# Patient Record
Sex: Female | Born: 1950 | Race: White | Hispanic: No | Smoking: Never smoker
Health system: Southern US, Community
[De-identification: ages and names within clinical notes are randomized; demographics above are authoritative.]

## PROBLEM LIST (undated history)

## (undated) DIAGNOSIS — K219 Gastro-esophageal reflux disease without esophagitis: Secondary | ICD-10-CM

## (undated) DIAGNOSIS — I10 Essential (primary) hypertension: Secondary | ICD-10-CM

## (undated) HISTORY — PX: TONSILLECTOMY: SUR1361

## (undated) HISTORY — DX: Essential (primary) hypertension: I10

## (undated) HISTORY — DX: Gastro-esophageal reflux disease without esophagitis: K21.9

## (undated) HISTORY — PX: ABDOMINAL HYSTERECTOMY: SHX81

## (undated) HISTORY — PX: BREAST BIOPSY: SHX20

## (undated) HISTORY — PX: OTHER SURGICAL HISTORY: SHX169

---

## 2008-02-04 ENCOUNTER — Ambulatory Visit: Payer: Self-pay | Admitting: Internal Medicine

## 2008-02-09 ENCOUNTER — Encounter: Payer: Self-pay | Admitting: Internal Medicine

## 2008-02-09 ENCOUNTER — Ambulatory Visit: Payer: Self-pay | Admitting: Internal Medicine

## 2008-02-09 ENCOUNTER — Ambulatory Visit (HOSPITAL_COMMUNITY): Admission: RE | Admit: 2008-02-09 | Discharge: 2008-02-09 | Payer: Self-pay | Admitting: Internal Medicine

## 2008-03-21 ENCOUNTER — Ambulatory Visit: Payer: Self-pay | Admitting: Internal Medicine

## 2008-10-14 ENCOUNTER — Ambulatory Visit: Payer: Self-pay | Admitting: Internal Medicine

## 2008-12-12 ENCOUNTER — Ambulatory Visit (HOSPITAL_BASED_OUTPATIENT_CLINIC_OR_DEPARTMENT_OTHER): Admission: RE | Admit: 2008-12-12 | Discharge: 2008-12-12 | Payer: Self-pay | Admitting: Specialist

## 2009-02-08 ENCOUNTER — Inpatient Hospital Stay (HOSPITAL_COMMUNITY): Admission: RE | Admit: 2009-02-08 | Discharge: 2009-02-11 | Payer: Self-pay | Admitting: Specialist

## 2010-01-19 ENCOUNTER — Inpatient Hospital Stay (HOSPITAL_COMMUNITY): Admission: RE | Admit: 2010-01-19 | Discharge: 2010-01-22 | Payer: Self-pay | Admitting: Specialist

## 2010-09-20 ENCOUNTER — Encounter (INDEPENDENT_AMBULATORY_CARE_PROVIDER_SITE_OTHER): Payer: Self-pay | Admitting: *Deleted

## 2010-09-26 NOTE — Letter (Signed)
Summary: Recall Office Visit  [facility]  [address]   [place], [place] [address]   Phone: [phone]  Fax: [phone]      September 20, 2010   Yvette [name] [address] [place], [place]  [address] 11/15/1950   Dear Ms. Cerrone,   According to our records, it is time for you to schedule a follow-up office visit with [place].   At your convenience, please call [phone] to schedule an office visit. If you have any questions, concerns, or feel that this letter is in error, we would appreciate your call.   Sincerely,    Diana Eves  [facility] Ph: [phone]   Fax: [phone]

## 2010-11-05 LAB — DIFFERENTIAL
Basophils Absolute: 0 10*3/uL (ref 0.0–0.1)
Basophils Relative: 1 % (ref 0–1)
Eosinophils Absolute: 0.1 10*3/uL (ref 0.0–0.7)
Eosinophils Relative: 3 % (ref 0–5)
Lymphocytes Relative: 30 % (ref 12–46)
Lymphs Abs: 1.7 10*3/uL (ref 0.7–4.0)
Monocytes Absolute: 0.4 10*3/uL (ref 0.1–1.0)
Neutrophils Relative %: 59 % (ref 43–77)

## 2010-11-05 LAB — BASIC METABOLIC PANEL
BUN: 5 mg/dL — ABNORMAL LOW (ref 6–23)
BUN: 8 mg/dL (ref 6–23)
CO2: 27 mEq/L (ref 19–32)
Calcium: 8.8 mg/dL (ref 8.4–10.5)
Chloride: 106 mEq/L (ref 96–112)
Chloride: 106 mEq/L (ref 96–112)
Creatinine, Ser: 0.69 mg/dL (ref 0.4–1.2)
GFR calc Af Amer: >60 mL/min (ref >60)
GFR calc Af Amer: >60 mL/min (ref >60)
GFR calc Af Amer: >60 mL/min (ref >60)
GFR calc non Af Amer: >60 mL/min (ref >60)
GFR calc non Af Amer: >60 mL/min (ref >60)
Potassium: 3.2 mEq/L — ABNORMAL LOW (ref 3.5–5.1)
Sodium: 137 mEq/L (ref 135–145)
Sodium: 140 mEq/L (ref 135–145)

## 2010-11-05 LAB — URINALYSIS, ROUTINE W REFLEX MICROSCOPIC
Bilirubin Urine: NEGATIVE
Bilirubin Urine: NEGATIVE
Glucose, UA: NEGATIVE mg/dL
Glucose, UA: NEGATIVE mg/dL
Hgb urine dipstick: NEGATIVE
Nitrite: NEGATIVE
Specific Gravity, Urine: 1.01 (ref 1.005–1.030)
Urobilinogen, UA: 0.2 mg/dL (ref 0.0–1.0)

## 2010-11-05 LAB — PROTIME-INR
INR: 1.17 (ref 0.00–1.49)
INR: 1.76 — ABNORMAL HIGH (ref 0.00–1.49)
INR: 1.02 (ref 0.00–1.49)
Prothrombin Time: 14.8 seconds (ref 11.6–15.2)
Prothrombin Time: 20.4 seconds — ABNORMAL HIGH (ref 11.6–15.2)

## 2010-11-05 LAB — CBC
HCT: 30.3 % — ABNORMAL LOW (ref 36.0–46.0)
HCT: 30.5 % — ABNORMAL LOW (ref 36.0–46.0)
HCT: 31.8 % — ABNORMAL LOW (ref 36.0–46.0)
HCT: 36.2 % (ref 36.0–46.0)
Hemoglobin: 10.3 g/dL — ABNORMAL LOW (ref 12.0–15.0)
MCHC: 33.7 g/dL (ref 30.0–36.0)
MCHC: 33.9 g/dL (ref 30.0–36.0)
MCV: 90.2 fL (ref 78.0–100.0)
MCV: 90.6 fL (ref 78.0–100.0)
Platelets: 270 10*3/uL (ref 150–400)
Platelets: 308 10*3/uL (ref 150–400)
RBC: 3.36 MIL/uL — ABNORMAL LOW (ref 3.87–5.11)
RBC: 3.99 MIL/uL (ref 3.87–5.11)
RDW: 14.4 % (ref 11.5–15.5)
RDW: 14.1 % (ref 11.5–15.5)
WBC: 12.2 10*3/uL — ABNORMAL HIGH (ref 4.0–10.5)
WBC: 13.3 10*3/uL — ABNORMAL HIGH (ref 4.0–10.5)

## 2010-11-05 LAB — COMPREHENSIVE METABOLIC PANEL
ALT: 27 U/L (ref 0–35)
Alkaline Phosphatase: 83 U/L (ref 39–117)
CO2: 25 mEq/L (ref 19–32)
Chloride: 108 mEq/L (ref 96–112)
GFR calc Af Amer: >60 mL/min (ref >60)
GFR calc non Af Amer: >60 mL/min (ref >60)
Glucose, Bld: 92 mg/dL (ref 70–99)
Total Bilirubin: 0.5 mg/dL (ref 0.3–1.2)

## 2010-11-05 LAB — TYPE AND SCREEN
ABO/RH(D): O NEG
Antibody Screen: NEGATIVE

## 2010-11-05 LAB — URINE CULTURE: Special Requests: NEGATIVE

## 2010-11-26 LAB — CBC
HCT: 30.5 % — ABNORMAL LOW (ref 36.0–46.0)
HCT: 32.5 % — ABNORMAL LOW (ref 36.0–46.0)
HCT: 33.7 % — ABNORMAL LOW (ref 36.0–46.0)
Hemoglobin: 10.2 g/dL — ABNORMAL LOW (ref 12.0–15.0)
Hemoglobin: 11 g/dL — ABNORMAL LOW (ref 12.0–15.0)
MCHC: 33.5 g/dL (ref 30.0–36.0)
MCHC: 33.9 g/dL (ref 30.0–36.0)
MCV: 87.9 fL (ref 78.0–100.0)
MCV: 89.1 fL (ref 78.0–100.0)
MCV: 89.4 fL (ref 78.0–100.0)
Platelets: 254 10*3/uL (ref 150–400)
Platelets: 273 10*3/uL (ref 150–400)
RBC: 3.41 MIL/uL — ABNORMAL LOW (ref 3.87–5.11)
RBC: 3.63 MIL/uL — ABNORMAL LOW (ref 3.87–5.11)
RBC: 3.78 MIL/uL — ABNORMAL LOW (ref 3.87–5.11)
RBC: 4.31 MIL/uL (ref 3.87–5.11)
RDW: 13.8 % (ref 11.5–15.5)
RDW: 14.2 % (ref 11.5–15.5)
RDW: 14.6 % (ref 11.5–15.5)
WBC: 13 10*3/uL — ABNORMAL HIGH (ref 4.0–10.5)
WBC: 13.6 10*3/uL — ABNORMAL HIGH (ref 4.0–10.5)

## 2010-11-26 LAB — BASIC METABOLIC PANEL
BUN: 4 mg/dL — ABNORMAL LOW (ref 6–23)
CO2: 24 mEq/L (ref 19–32)
CO2: 26 mEq/L (ref 19–32)
Calcium: 9 mg/dL (ref 8.4–10.5)
Chloride: 100 mEq/L (ref 96–112)
Chloride: 106 mEq/L (ref 96–112)
GFR calc Af Amer: >60 mL/min (ref >60)
GFR calc Af Amer: >60 mL/min (ref >60)
GFR calc Af Amer: >60 mL/min (ref >60)
GFR calc non Af Amer: >60 mL/min (ref >60)
GFR calc non Af Amer: >60 mL/min (ref >60)
Glucose, Bld: 105 mg/dL — ABNORMAL HIGH (ref 70–99)
Potassium: 3.7 mEq/L (ref 3.5–5.1)
Potassium: 4.1 mEq/L (ref 3.5–5.1)
Potassium: 4.2 mEq/L (ref 3.5–5.1)
Sodium: 132 mEq/L — ABNORMAL LOW (ref 135–145)
Sodium: 138 mEq/L (ref 135–145)

## 2010-11-26 LAB — COMPREHENSIVE METABOLIC PANEL
ALT: 27 U/L (ref 0–35)
Albumin: 4.2 g/dL (ref 3.5–5.2)
Calcium: 9.7 mg/dL (ref 8.4–10.5)
Glucose, Bld: 124 mg/dL — ABNORMAL HIGH (ref 70–99)
Sodium: 141 mEq/L (ref 135–145)
Total Protein: 7 g/dL (ref 6.0–8.3)

## 2010-11-26 LAB — DIFFERENTIAL
Basophils Absolute: 0 10*3/uL (ref 0.0–0.1)
Eosinophils Absolute: 0.2 10*3/uL (ref 0.0–0.7)
Lymphs Abs: 1.8 10*3/uL (ref 0.7–4.0)
Monocytes Relative: 5 % (ref 3–12)
Neutro Abs: 3.7 10*3/uL (ref 1.7–7.7)
Neutrophils Relative %: 62 % (ref 43–77)

## 2010-11-26 LAB — TYPE AND SCREEN: ABO/RH(D): O NEG

## 2010-11-26 LAB — ABO/RH: ABO/RH(D): O NEG

## 2010-11-26 LAB — PROTIME-INR
INR: 1.8 — ABNORMAL HIGH (ref 0.00–1.49)
INR: 2 — ABNORMAL HIGH (ref 0.00–1.49)
Prothrombin Time: 13.8 seconds (ref 11.6–15.2)
Prothrombin Time: 24.1 seconds — ABNORMAL HIGH (ref 11.6–15.2)

## 2010-11-26 LAB — URINALYSIS, ROUTINE W REFLEX MICROSCOPIC
Glucose, UA: NEGATIVE mg/dL
Hgb urine dipstick: NEGATIVE
Specific Gravity, Urine: 1.006 (ref 1.005–1.030)
pH: 6 (ref 5.0–8.0)

## 2010-11-28 LAB — POCT I-STAT 4, (NA,K, GLUC, HGB,HCT)
Glucose, Bld: 90 mg/dL (ref 70–99)
HCT: 42 % (ref 36.0–46.0)
Hemoglobin: 14.3 g/dL (ref 12.0–15.0)
Potassium: 4 mEq/L (ref 3.5–5.1)

## 2011-01-01 NOTE — Assessment & Plan Note (Signed)
NAMECHRYSTA, [name]                 CHART#:  [id number]   DATE:  10/14/2008                       DOB:  [birth year]   Followup GERD/erosive reflux esophagitis, history of Schatzki ring  status post dilation previously.  [name] has done extremely well  on Aciphex 20 mg orally daily.  Reflux symptoms have been well-  controlled.  She remains significantly over her ideal body weight.  No  dysphagia, abdominal pain, melena or rectal bleeding.  She is up-to-date  on screening colonoscopy as she reports per Dr. Cleotis Nipper some 5 years  ago.  She sees Dr. Daphine Deutscher over at [facility] for  her primary care needs.   CURRENT MEDICATIONS:  See updated list.   ALLERGIES:  No known drug allergies.   PHYSICAL EXAMINATION:  On exam today she looks well.  Weight 235, height  5 feet 4-1/2 inches.  Temperature 98, BP 128/70, pulse 76.  SKIN:  Warm and dry.  CHEST:  Lungs are clear to auscultation.  CARDIAC:  Regular rate and rhythm without murmur, gallop, rub.  ABDOMEN:  Obese, nondistended, positive bowel sounds, soft, nontender  without appreciable mass or organomegaly.   ASSESSMENT:  History of gastroesophageal reflux disease/erosive reflux  esophagitis.  Symptoms well-controlled on Aciphex.  She has no recurrent  dysphagia symptoms.   RECOMMENDATIONS:  1. Antireflux lifestyle/diet.  2. Continue Aciphex 20 mg orally daily.  She may or may not need her      esophagus dilated in the future.  Any recurrence of symptoms would      dictate that approach.  Unless something comes up, we will see Ms.      Yvette [name] back in the office in 2 years.  We will keep her      Aciphex prescription current until she returns.  Of course, should      she have any interim problems, she is urged to let me know.       Jonathon Bellows, M.D.  Electronically Signed     RMR/MEDQ  D:  10/14/2008  T:  10/14/2008  Job:  [id number]   cc:   Daphine Deutscher, M.D.

## 2011-01-01 NOTE — H&P (Signed)
NAMECARDELIA, [name]              ACCOUNT NO.:  [id number]   MEDICAL RECORD NO.:  [id number]          PATIENT TYPE:  INP   LOCATION:                               FACILITY:  [facility]   PHYSICIAN:  Jene Every, M.D.    DATE OF BIRTH:  1951-07-17   DATE OF ADMISSION:  02/08/2009  DATE OF DISCHARGE:                              HISTORY & PHYSICAL   CHIEF COMPLAINT:  Left knee pain.   HISTORY OF PRESENT ILLNESS:  Yvette [name] is a pleasant 60 year old  female who has had off and on bilateral knee pain for quite some time.  She recently underwent a right knee arthroscopy and is doing well from  this.  Unfortunately, she notes worsening of her left knee pain.  She  has had previous arthroscopy done in the past with only short-term  relief.  Radiographs do reveal end-stage osteoarthrosis of the left knee  with some loss of range of motion.  It is felt at this point the patient  would benefit from a total knee arthroplasty.  Risks and benefits of the  surgery were discussed with the patient and she does wish to proceed.   PAST MEDICAL HISTORY:  Significant for hypertension, gastroesophageal  reflux disease, and seasonal allergies.   CURRENT MEDICATIONS:  Amlodipine 10 mg one p.o. daily, benazepril HCl 20  mg one p.o. daily, Aciphex 20 mg one p.o. daily, fexofenadine HCl 180 mg  one p.o. daily, half an aspirin 325 one p.o. daily, magnesium 250 mg  daily, D3 1000 units, fish oil 1000 mg daily, calcium with D 600 mg  daily, Super B complex, flax oil.   ALLERGIES:  None.   SOCIAL HISTORY:  The patient is divorced.  She is a Runner, broadcasting/film/video.  History is  negative for tobacco.  Occasional or rare alcohol consumption.   PRIMARY CARE PHYSICIAN:  Paulene Floor at [facility].   PAST SURGICAL HISTORY:  Hysterectomy, right knee arthroscopy, left knee  arthroscopy, esophageal stretch.   FAMILY HISTORY:  Father deceased at 54 with coronary artery disease.  Mother deceased at 51  with cirrhosis of the liver.  She does have a  niece who has breast cancer.  A sibling was killed in the motor vehicle  accident at the age of 42.   REVIEW OF SYSTEMS:  GENERAL:  The patient denies any fever, chills,  night sweats or bleeding tendencies.  CNS: No blurred double vision, seizure, headache or paralysis.  RESPIRATORY:  No shortness of breath, productive cough or hemoptysis.  CARDIOVASCULAR:  No chest pain, angina or orthopnea.  GU:  No dysuria, hematuria or discharge.  The patient does note stress  incontinence.  GI: No nausea, vomiting, diarrhea, constipation or melena.  MUSCULOSKELETAL:  As per HPI.   PHYSICAL EXAMINATION:  Pulse 60, respiratory rate 10, BP 146/84.  GENERAL:  This is an overweight female sitting upright, in no acute  distress.  She does walk with a slight antalgic gait.  HEENT: Atraumatic, normocephalic.  Pupils equal, round, and reactive to  light.  EOMs intact.  NECK:  Supple.  No lymphadenopathy.  CHEST:  Clear to auscultation bilaterally.  No rhonchi, wheezes or  rales.  BREASTS/GU:  Not examined, not pertinent to HPI.  HEART:  Regular rate and rhythm without murmurs, gallops or rubs.  ABDOMEN:  Soft, nontender, nondistended.  Bowel sounds x4.  SKIN:  No rashes or lesions are noted.  LEFT KNEE:  There is mild effusion noted.  She is tender along the  medial joint line.  She does have pain with patellofemoral compression.  Range of motion is -3 to 110 degrees.   IMPRESSION:  Degenerative joint disease, left knee.   PLAN:  The patient will be admitted to [facility] and undergo  a left total knee arthroplasty.      Roma Schanz, P.A.      Jene Every, M.D.  Electronically Signed    CS/MEDQ  D:  01/30/2009  T:  01/30/2009  Job:  [id number]

## 2011-01-01 NOTE — Op Note (Signed)
NAME:  Yvette [name], Yvette [name]              ACCOUNT NO.:  [id number]   MEDICAL RECORD NO.:  [id number]          PATIENT TYPE:  AMB   LOCATION:  DAY                           FACILITY:  [facility]   PHYSICIAN:  R. Roetta Sessions, M.D. DATE OF BIRTH:  [birth year]   DATE OF PROCEDURE:  02/09/2008  DATE OF DISCHARGE:                               OPERATIVE REPORT   PROCEDURES:  EGD with Elease Hashimoto dilation followed by esophagus biopsy and  gastric biopsy.   INDICATIONS:  A 60 year old lady with history of intermittent dysphagia  for solids having treatment for regurgitation episodes for which she  takes Tums and Zantac on a regular basis.  EGD is now being done.  Potential for esophageal dilation reviewed.  Risk, benefits, and  alternatives have been discussed.  She is agreeable.  Please see  documentation medical record.   PROCEDURE NOTE:  O2 saturation, blood pressure, pulse, and respirations  were monitored throughout the entire procedure.   CONSCIOUS SEDATION:  Versed 5 mg IV and Demerol 125 mg IV in divided  doses.  Cetacaine spray for topical pharyngeal anesthesia.   FINDINGS:  Examination of tubular esophagus revealed circumferential  distal esophageal erosions.  There was no noncritical-appearing  Schatzki's ring.  Also, there were numerous 1-2 mm raised, pale nodules  throughout the esophageal mucosa.  Please see photos.  EG junction was  easily traversed with the scope.  Stomach:  Gastric cavity was empty and  insufflated well with air.  Thorough examination of the gastric mucosa  including retroflexion of the proximal stomach and esophagogastric  junction demonstrated nodularity of the antral folds with couple of tiny  superficial erosions.  There was no infiltrating process or ulcer.  Pylorus was patent and easily traversed.  Examination of the bulb and  second portion revealed no abnormalities.  Therapeutic/diagnostic  maneuvers performed:  Scope was withdrawn.  A 56-French Maloney  dilator  was passed at full insertion.  Look back revealed no apparent  complications related to the passage of the dilator.  Subsequently, one  of the nodular areas with erosion was biopsied for histologic study from  the antrum and subsequently one of the nodules in the esophagus were  biopsied for histologic study.  The patient tolerated the procedure well  and was reacted in Endoscopy.   IMPRESSION:  1. 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  2. Circumferential distal esophageal erosions consistent with mild      erosive reflux esophagitis.  3. Nodularity of the antrum with overlying erosions of uncertain      clinical significance, status post biopsy, otherwise normal      stomach, patent pylorus, normal D1 and D2.   RECOMMENDATIONS:  1. Antireflux list provided to [name].  Begin Prilosec 20 mg      orally daily.  2. Followup on path.  3. Further recommendations to follow.      Yvette [name], M.D.  Electronically Signed     RMR/MEDQ  D:  02/09/2008  T:  02/10/2008  Job:  [id number]   cc:   [facility] [place]  Fax: [phone]

## 2011-01-01 NOTE — Assessment & Plan Note (Signed)
NAMEBECKY, COLAN                 CHART#:  [id number]   DATE:  03/21/2008                       DOB:  1950-09-05   CHIEF COMPLAINT:  Followup EGD/GERD.   PROBLEM LIST:  1. Erosive reflux esophagitis with last EGD on 02/09/2008 by Dr.      Jena Gauss.  2. Dysphagia, which is resolved.   SUBJECTIVE:  The patient is a 60 year old Caucasian female with history  of intermittent esophageal dysphagia, regurgitation, and chronic GERD.  She was found to have an one 2 mm pale, raised nodules at the esophagus,  which were multiple and benign.  She had a hyperplastic polyp removed  from the antrum as well.  There was no evidence of H. pylori organisms.  She had mild erosive reflux esophagitis.  Overall, the patient is doing  very well.  She denies any heartburn, indigestion, dysphagia, or  odynophagia.  At this time, her appetite is good.  Denies any anorexia  or early satiety.  She is having some bloating and gassiness that she  has noticed and she started using Prilosec and she is wondering if there  is something that may not cause this side effect.   CURRENT MEDICATIONS:  See the list of 03/21/2008.   ALLERGIES:  No known drug allergies.   OBJECTIVE:  VITAL SIGNS:  Weight 232.5 pounds, height 64 inches,  temperature 98.7, blood pressure 130/80, and pulse 72.  GENERAL:  She is a well-developed and well-nourished, an obese Caucasian  female who is in no acute distress.  HEENT.  Sclerae clear and nonicteric.  Conjunctivae pink.  Oropharynx  pink and moist without any lesions.  CHEST:  Heart regular rate and rhythm.  Normal S1 and S2.  ABDOMEN:  Protuberant with positive bowel sounds x4.  No bruits  auscultated.  Soft, nontender and nondistended without palpable mass or  hepatosplenomegaly.  No rebound, tenderness, or guarding.  Exam is  limited given the patient's body habitus.  EXTREMITIES:  Without clubbing or edema.  SKIN:  Pink, warm, and dry.   ASSESSMENT:  Erosive reflux  esophagitis/complicated gastroesophageal  reflux disease and dysphagia, which has resolved on PPI.   Complains of gassiness and bloating with Prilosec.   PLAN:  Discontinue Prilosec and begin Aciphex 20 mg daily.  I have given  her 2 weeks worth of samples as well as a prescription for 30 with 5  refills.  Office visit in 6 months with Dr. Jena Gauss, at which time we can  determine whether she is going to need a long-term PPI use.       Lorenza Burton, N.P.  Electronically Signed     R. Roetta Sessions, M.D.  Electronically Signed    KJ/MEDQ  D:  03/22/2008  T:  03/23/2008  Job:  [id number]   cc:   [facility]

## 2011-01-01 NOTE — Op Note (Signed)
NAMEWANETA, FITTING              ACCOUNT NO.:  [id number]   MEDICAL RECORD NO.:  [id number]          PATIENT TYPE:  AMB   LOCATION:  [facility]                         FACILITY:  [facility]   PHYSICIAN:  Jene Every, M.D.    DATE OF BIRTH:  1951/07/15   DATE OF PROCEDURE:  12/12/2008  DATE OF DISCHARGE:                               OPERATIVE REPORT   PREOPERATIVE DIAGNOSES:  Degenerative joint disease, right knee, medial  and lateral meniscus tears.   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   PROCEDURE PERFORMED:  1. Right knee arthroscopy.  2. Partial medial and lateral meniscectomy.  3. Removal of loose body.  4. Chondroplasty of the patella, femoral sulcus, medial femoral      condyle.   BRIEF HISTORY:  A 60 year old with refractory knee pain.  MRI  radiographs indicating degenerative changes and meniscal tears  refractory to conservative treatment indicated for arthroscopic  evaluation, debridement, partial meniscectomy.  The risks and benefits  were discussed including bleeding, infection, damage to neurovascular  structures, no change in symptoms, worsening of symptoms, need for  repeat debridement, total knee arthroplasty, etc.   TECHNIQUE:  With the patient in supine position.  After induction of  adequate anesthesia 2 grams of Kefzol the right lower extremity was  prepped and draped in the sterile fashion.  A lateral parapatellar  portal and superomedial parapatellar portal was fashioned with a #11  blade.  Ingress cannula atraumatically placed.  Irrigant was utilized to  insufflate joint.  Under direct visualization the medial parapatellar  portal was fashioned with a #11 blade after localization with a 18 gauge  needle sparing the medial meniscus.  Noted immediately was a large  centimeter of loose body beneath femoral condyle.  This was retrieved  with a pituitary and saved per the patient's request.  Chondral flap  tears and extensive grade III changes in the femoral condyle was noted.  The shaver was introduced and utilized to perform a chondroplasty of the  medial femoral condyle to a stable base.  This was sized of  approximately to 2.5 x 2.5 cm.  The torn anterior medial meniscus was  noted and this was resected with a basket rongeur and further contoured  with a shaver and an Arthro-wand.  This was torn posteriorly and this  was resected in a similar fashion to a stable base, approximately 50% of  the meniscus was resected.  There was grade III changes of the tibial  plateau and chondroplasty was performed here.   At the intercondylar notch with fraying of the ACL and PCL, but it was  otherwise intact.   The lateral compartment revealed tearing extensively of the lateral  meniscus complex.  A basket rongeur was introduced and utilized to  perform partial lateral meniscectomy to a stable base and further  contoured with a 4.2 Kuda shaver and a Arthro-wand.  The remnant was  then stable to probe palpation as was the medial meniscus  and observed  through flexion and extension without obstruction.  Next, examination of  the suprapatellar pouch revealed extensive grade III changes of the  patellofemoral sulcus.  A chondroplasty was performed here as well.  There was normal patellofemoral tracking.  The gutters were unremarkable  as well.  Hypertrophic synovitis was noted in the suprapatellar pouch.  This was excised as well.   Next, the knee was copiously lavaged.  All compartments reexamined.  The  remnant of the menisci was stable to probe palpation.  No evidence of  residual loose bodies or debris amenable to arthroscopic intervention.   The knee was copiously lavaged.  All instrumentation was removed.  The  portals were closed with 4-0 nylon simple  sutures.  The 0.25% Marcaine  with epinephrine was infiltrated in the joint.  The wound was dressed  sterilely.  Awoken without difficulty and transported to the recovery  room in satisfactory condition.   The patient tolerated the procedure.  No complication.  No assistant.      Jene Every, M.D.  Electronically Signed     JB/MEDQ  D:  12/12/2008  T:  12/12/2008  Job:  [id number]

## 2011-01-01 NOTE — Op Note (Signed)
Yvette [name], Yvette [name]              ACCOUNT NO.:  [id number]   MEDICAL RECORD NO.:  [id number]          PATIENT TYPE:  INP   LOCATION:  0003                         FACILITY[facility]   PHYSICIAN:  Jene Every, M.D.    DATE OF BIRTH:  [birth year]   DATE OF PROCEDURE:  02/08/2009  DATE OF DISCHARGE:                               OPERATIVE REPORT   PREOPERATIVE DIAGNOSIS:  Degenerative joint disease, left knee.   POSTOPERATIVE DIAGNOSIS:  Degenerative joint disease, left knee.   PROCEDURE PERFORMED:  Left total knee arthroplasty.   ANESTHESIA:  Spinal.   ASSISTANT:  Roma Schanz, P.A.   COMPONENTS:  DePuy rotating platform, 4 femur, 4tibia, 12.5-mm insert,  38 patella.   BRIEF HISTORY:  A 60 year old with end-stage osteoarthrosis of the knee  refractory to conservative treatment.  X-rays indicating degenerative  change in the medial compartment and slight varus deformity and  indicated for full replacement.  Risks, benefits discussed including  bleeding, infection, damage to neurovascular structures, suboptimal  range of motion, DVT, PE, component loosening and need for revision,  arthrofibrosis, et Karie Soda.   TECHNIQUE:  The patient in supine position.  After adequate induction of  adequate general anesthesia, 2 grams Kefzol, the left lower extremity  was prepped and draped and exsanguinated in the usual sterile fashion.  Thigh tourniquet inflated to 300 mmHg.  Midline incision was then made.  Median parapatellar arthrotomy was performed.  Patella was everted, knee  was flexed.  We elevated the superficial soft tissues medially around  the patella and removed osteophytes with the [facility] rongeur.  Medial and  lateral menisci remnants were removed.  ACL was removed as well.  End-  stage arthrosis __________ of the medial compartment was noted.  Synovitis was noted.  Synovectomy was performed.  Clear synovial fluid  had been evacuated.  Erosive changes of the patella was noted as  well.  Step drill was utilized to enter the femoral canal, it was irrigated and  then an intramedullary guide 5 degree left was then placed, 10 off the  distal femur.  Oscillating saw was utilized to make this cut.  Soft  tissues well-protected and then used a sizing guide off the anterior  cortex, measured to a 4.  It was pinned in slight external rotation and  then anterior and posterior chamfer cuts were performed.  Again, the  soft tissue was well-protected.  Then turned towards the tibia and  subluxing the tibia with external alignment guide, bisecting the ankle  parallel to the tibia, zero degree cut, 10 off the high side which was  lateral.  This was checked with a__________ prior to that.  Felt to be a  satisfactory cut, so that this was then pinned. an oscillating saw  utilized to make the tibial cut.  We then used a trial spacer and it was  12.5 in flexion and extension, equivalent and stable in flexion and  extension.  Next, the wound was copiously irrigated.  The knee was  flexed.  Tibia subluxed, __________ were utilized.  We used a baseplate  on the tibia in the appropriate  rotation which was just medial to the  tibial tubercle.  Maximal coverage of the tibial plateau measured to be  a 4.  This was pinned, centrally drilled and the punch guide performed  and then left.  We then used a trial femur.  We used first the regular,  then the narrow.  The narrow seemed to fit better.  Just prior to the  placement of the femoral component, we used a box cut distal femoral  cutting jig and this was applied, bisecting the femoral condyles.  Box  cut was performed protecting the soft tissues with the initial cut and  initial blade.  We then put again as previously stated the trial femur,  first the regular, then the narrow, and reduced it with a 12.5 insert  full extension, full flexion, no lift-off.  Good stability to varus and  valgus stressing 0 to 30 degrees.  Patella was then  everted, measured at  23 and a 35, 38, it was 8 off of that.  We therefore, used a patellar  guide and removed 8 off of the thickness of the patella with an  oscillating saw.  This was trialed to 38.  The peg holes were drilled,  maximizing coverage, medializing the patella.  Patella showed normal  tracking with flexion/extension.  All trials were then removed and  inspected posteriorly in the joint for osteophytes and loose bodies,  there were none.  Menisci had been removed.  The popliteus was intact.  Wound copiously irrigated with pulsatile lavage at low pressure.  Then  knee was flexed and dried.  Next, the cement mixed on the back table in  the appropriate fashion, injected into the proximal tibia and then we  impacted the tibial 4 component into place.  Redundant cement removed.  A trial insert was then placed and we cemented and impacted the femur.  Redundant cement removed.  The 12.5 insert was then placed.  The knee  was then reduced.  Full extension was applied.  Redundant cement  removed.  Patella was cemented as well.  The patellar button was  cemented as well.  The patellar clamp and then the cement removed after  the appropriate curing of cement and this was tried, full extension and  full flexion.  Good stability to varus and valgus stressing at 0 and 30  degrees.  Negative anterior drawer.  We selected the 12.5.  This was  then removed and we checked posteriorly and removed redundant cement  with osteotomes and pickups.  We put the permanent 12.5 insert in and  again full extension full flexion, good stability to varus and valgus  stressing 0 to 30 degrees.  Normal patellofemoral tracking.  Next, the  wound  was again copiously irrigated with antibiotic irrigation.  Hemovac placed and brought out through a lateral stab wound on the skin  and Marcaine with epinephrine was placed in the wound.  Repair of the  patellar arthrotomy in slight flexion with #1 Vicryl interrupted  figure-  of-eight sutures, subcu with 0 and 2-0 Vicryl simple sutures.  Skin was  reapproximated with staples.  We had flexion against gravity at 90  degrees.  Tourniquet was deflated at 2 hours and there was adequate  revascularization of the lower extremity appreciated.  We placed a  sterile dressing on the wound and secured it with Ace bandages.   The patient was then transported to the hospital bed, extubated without  difficulty and transported to recovery room in satisfactory  condition.   The patient tolerated the procedure well, no complications.      Jene Every, M.D.  Electronically Signed     JB/MEDQ  D:  02/08/2009  T:  02/08/2009  Job:  [id number]

## 2011-01-01 NOTE — H&P (Signed)
NAME:  Yvette [name], Yvette [name]              ACCOUNT NO.:  [id number]   MEDICAL RECORD NO.:  [id number]          PATIENT TYPE:  AMB   LOCATION:  DAY                           FACILITY:  [facility]   PHYSICIAN:  R. Roetta Sessions, M.D. DATE OF BIRTH:  [birth year]   DATE OF ADMISSION:  DATE OF DISCHARGE:  LH                              HISTORY & PHYSICAL   CHIEF COMPLAINT:  Food getting stuck.   HISTORY OF PRESENT ILLNESS:  [name] is a 60 year old Caucasian  female.  She has had a 2-year history of intermittent dysphagia.  More  recently over the last 4 months she is having problems just about every  time she eats where food gets stuck, she feels in her mid esophagus.  She tells me it feels like a gas bubble builds up and nothing will  pass.  She occasionally regurgitates undigested food within a few  minutes of eating.  If she tries to drink, the liquid will come back as  well.  Otherwise if she slowly drinks liquid, she denies any problems  with dysphagia.  Denies any odynophagia.  Denies any nausea or vomiting.  She does occasionally have heartburn and indigestion, usually with spicy  foods.  She denies any anorexia or early satiety.  Denies any fatigue.  She is having 1-2 soft brown bowel movements a day.  Denies any rectal  bleeding or melena.  Denies any history of diarrhea or constipation.   PAST MEDICAL AND SURGICAL HISTORY:  1. She had a colonoscopy in 2006 by Dr. Sherin Quarry, which is normal.  2. She has a history of hypertension.  3. Seasonal allergies.  4. Arthroscopic knee surgery for meniscal disease.  5. She has a history of complete hysterectomy.  6. Right ankle surgery.  7. Tonsillectomy.   CURRENT MEDICATIONS:  1. Benazepril HCl 20 mg b.i.d.  2. Norvasc 10 mg daily.  3. Calcium once daily.  4. Vitamin D once daily.  5. Vitamin B once daily.  6. Zyrtec 10 mg daily.  7. Over-the-counter acid reflux pills p.r.n.   ALLERGIES:  No known drug allergies.   FAMILY HISTORY:   There is no known family history of colorectal  carcinoma or chronic GI problems.  Mother deceased at age 8 with  history of alcoholism.  Father deceased at 24 secondary to coronary  artery disease and diabetes mellitus.  She has 2 living sisters, 1  brother and has lost 1 sister.   SOCIAL HISTORY:  Mrs. Kreiter is divorced.  She has 3 children who  reside with her, age is 31, 52, and 15.  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  She denies any tobacco or drug use.  She occasionally  consumes alcoholic beverages.   REVIEW OF SYSTEMS:  See HPI, otherwise negative.   PHYSICAL EXAMINATION:  VITAL SIGNS:  Weight 232, height 64-1/2 inches,  temperature 98.3, blood pressure 130/82, and pulse 60.  GENERAL:  [name] is an obese Caucasian female who is alert,  oriented, pleasant, and cooperative in no acute distress.  HEENT:  Sclerae clear, nonicteric.  Conjunctivae pink.  Oropharynx pink  and moist without any lesions.  NECK:  Supple without mass or thyromegaly.  CHEST:  Heart regular rate and rhythm.  Normal S1 and S2 without  murmurs, clicks, rubs, or gallops.  LUNGS:  Clear to auscultation bilaterally.  ABDOMEN:  Positive bowel sounds x4.  No bruits auscultated.  Soft,  nontender, and nondistended without palpable mass or hepatosplenomegaly.  No rebound tenderness or guarding.  EXTREMITIES:  Without clubbing or edema bilaterally.  SKIN:  Pink, warm, and dry without any rash or jaundice.   IMPRESSION:  Ms. Stantz is a 60 year old Caucasian female with a 2-  year history of intermittent dysphagia with symptoms that have worsened  over the last 4 months including regurgitation symptoms that are mostly  present with solid foods, and she does have occasional heartburn.  Her  symptoms are suspicious for esophageal web, ring, or stricture and she  is going to need further evaluation to rule this out as well as  symptomatic hernia and complicated gastroesophageal reflux disease.    PLAN:  EGD with possible esophageal dilatation with Dr. Jena Gauss in the  near future.  Discussed this procedure including risks and benefits with  the patient but not limited to bleeding, infection, perforation, and  drug reaction.  She agrees with the plan, consent will be obtained.      Lorenza Burton, N.P.      Jonathon Bellows, M.D.  Electronically Signed    KJ/MEDQ  D:  02/04/2008  T:  02/05/2008  Job:  [id number]

## 2011-01-04 NOTE — Discharge Summary (Signed)
Yvette [name], Yvette [name]              ACCOUNT NO.:  [id number]   MEDICAL RECORD NO.:  [id number]          PATIENT TYPE:  INP   LOCATION:  1619                         FACILITY:  [facility]   PHYSICIAN:  Jene Every, M.D.    DATE OF BIRTH:  [birth year]   DATE OF ADMISSION:  02/08/2009  DATE OF DISCHARGE:  02/11/2009                               DISCHARGE SUMMARY   ADMISSION DIAGNOSES:  1. Degenerative joint disease, left knee.  2. Hypertension.  3. Gastroesophageal reflux disease.  4. Seasonal allergies.   DISCHARGE DIAGNOSES:  1. Degenerative joint disease, left knee.  2. Hypertension.  3. Gastroesophageal reflux disease.  4. Seasonal allergies.  5. Status post left total knee arthroplasty.  6. Asymptomatic hyponatremia, resolved.  Otherwise, same as above.   HISTORY:  Ms. Dunnigan is pleasant 60 year old female who has had off  and on bilateral knee pain for quite some time.  She underwent a right  knee arthroscopy recently and is doing well from this.  Unfortunately,  she has noted worsening of her left knee pain.  She had a previous left  knee arthroscopy done in the past with only short-term relief of  symptoms.  Radiographs of the left knee do reveal end-stage  osteoarthritis of the left knee with slight flexion contractures.  It is  felt at this point she would benefit from a total knee arthroplasty.  The risks and benefits of this were discussed with the patient.  She  does wish to proceed.   PROCEDURE:  The patient was taken to the operating room on February 08, 2009, and underwent a left total knee arthroplasty.   SURGEON:  Jene Every, M.D.   ASSISTANT:  Roma Schanz, PA-C.   ANESTHESIA:  Spinal.   COMPLICATIONS:  None.   TOURNIQUET TIME:  2 hours.   CONSULTATIONS:  PT/OT, Care Management.   LABORATORY DATA:  Preoperative CBC shows white cell count 5.9,  hemoglobin 13.1, hematocrit 37.9.  This was monitored throughout the  hospital course.  The patient did  have a spike in her white blood cell  count to 15.3.  This has started to trend downward and at the time of  discharge is 13.6.  Hemoglobin remained stable at time of discharge  10.2, hematocrit 30.5.  Routine coagulation studies done preoperatively  were within normal range.  The patient was placed on Coumadin  postoperatively for DVT prophylaxis.  At time of discharge, she was  therapeutic on her Coumadin with an INR of 2.0.  Routine chemistries  done preoperatively showed sodium 141, calcium 3.7, with glucose of 129.  Normal BUN and creatinine.  These, again, were monitored throughout the  hospital course.  She had a slight drop in her sodium to 132, but at  time of discharge normalized to 138.  Potassium at time of discharge was  3.7.  BUN, again, was in normal range.  GFR was greater than 60.  Routine liver function tests were within normal range.  Preoperative  urinalysis was negative.  Blood type is O negative.   Preoperative chest x-ray showed no acute cardiopulmonary findings.  There  is a small density in the right upper lobe, questionable whether  this is an overlap in vascular structures.  Recommend correlation with  previous chest x-ray or followup chest x-ray in approximately 4 months.   HOSPITAL COURSE:  The patient was taken to the operating room, underwent  the above stated procedure without difficulty.  One Hemovac drain was  placed intraoperatively.  She was then transferred to PACU and then to  the orthopedic floor for continued postoperative care.  She was placed  on PCA for pain relief.  Coumadin was started for DVT prophylaxis.  Postop day #1, the patient was doing fairly well.  Vital signs were  stable.  She wasafebrile.  Labs were stable.  She had slight  hyponatremia.  We consulted PT/OT.  Encouraged out of bed.  The patient  did well with therapy on postop day #1.  On postop day #2, she continued  do well.  Pain was well-controlled on p.o. analgesics.  She had  been up  and down out of bed to the restroom, she was voiding, and she was  passing flatus without difficulty.  Hemovac was discontinued.  Drain was  changed.  Incision was clean and dry.  Labs again remained stable.  Hemoglobin was 11.0.  She had an INR of 1.8.  The patient continued to  progress well with therapy.  On postop day #3, she had met all goals.  Again, incision was clean and dry.  Motor and neurovascular were intact  to the lower extremities.  It was felt at this point, the patient was  stable to be discharged to home.   DISPOSITION:  The patient was discharged home with Home Health PT/OT.  Wound Care is to change her dressing daily.  She is to follow with Dr.  Shelle Iron in approximately 10-14 days.  She is to call and make an  appointment.   DISCHARGE MEDICATIONS:  Include all home medications other than the anti-  inflammatories or aspirin.  She will take vitamin C 500 mg daily, Norco  p.r.n. pain, Robaxin p.r.n. spasm.  Coumadin as dosed per pharmacy.   She is to use her knee immobilizer until she can do straight leg raises  x10 and continue elevation at least 6x day for 20 minutes at a time.   CONDITION ON DISCHARGE:  Stable.   FINAL DIAGNOSIS:  Doing well status post left total knee arthroplasty.      Roma Schanz, P.A.      Jene Every, M.D.  Electronically Signed    CS/MEDQ  D:  03/02/2009  T:  03/02/2009  Job:  [id number]

## 2012-11-07 ENCOUNTER — Other Ambulatory Visit: Payer: Self-pay | Admitting: *Deleted

## 2012-11-25 ENCOUNTER — Other Ambulatory Visit: Payer: Self-pay | Admitting: *Deleted

## 2012-11-25 MED ORDER — AMLODIPINE BESYLATE 10 MG PO TABS: 10.0000 mg | ORAL_TABLET | Freq: Every day | ORAL | Status: DC

## 2012-11-25 MED ORDER — BENAZEPRIL HCL 20 MG PO TABS: 20.0000 mg | ORAL_TABLET | Freq: Two times a day (BID) | ORAL | Status: DC

## 2013-01-04 ENCOUNTER — Other Ambulatory Visit: Payer: Self-pay

## 2013-01-04 MED ORDER — BENAZEPRIL HCL 20 MG PO TABS: 20.0000 mg | ORAL_TABLET | Freq: Two times a day (BID) | ORAL | Status: DC

## 2013-01-04 MED ORDER — AMLODIPINE BESYLATE 10 MG PO TABS: 10.0000 mg | ORAL_TABLET | Freq: Every day | ORAL | Status: DC

## 2013-01-04 NOTE — Telephone Encounter (Signed)
Last refill told needed to be seen  No upcoming appt scheduled last seen 10/23/11

## 2013-02-02 ENCOUNTER — Other Ambulatory Visit: Payer: Self-pay | Admitting: *Deleted

## 2013-02-02 MED ORDER — AMLODIPINE BESYLATE 10 MG PO TABS: 10.0000 mg | ORAL_TABLET | Freq: Every day | ORAL | Status: DC

## 2013-02-02 MED ORDER — BENAZEPRIL HCL 20 MG PO TABS: 20.0000 mg | ORAL_TABLET | Freq: Two times a day (BID) | ORAL | Status: DC

## 2013-02-02 NOTE — Telephone Encounter (Signed)
Has scheduled appt with Bennie Pierini 02/17/2013

## 2013-02-17 ENCOUNTER — Ambulatory Visit (INDEPENDENT_AMBULATORY_CARE_PROVIDER_SITE_OTHER): Payer: BC Managed Care – PPO | Admitting: Nurse Practitioner

## 2013-02-17 ENCOUNTER — Encounter: Payer: Self-pay | Admitting: Nurse Practitioner

## 2013-02-17 VITALS — BP 132/75 | HR 64 | Temp 98.1°F | Ht 64.0 in | Wt 230.0 lb

## 2013-02-17 DIAGNOSIS — Z23 Encounter for immunization: Secondary | ICD-10-CM

## 2013-02-17 DIAGNOSIS — I10 Essential (primary) hypertension: Secondary | ICD-10-CM

## 2013-02-17 DIAGNOSIS — K219 Gastro-esophageal reflux disease without esophagitis: Secondary | ICD-10-CM

## 2013-02-17 MED ORDER — AMLODIPINE BESYLATE 10 MG PO TABS: 10.0000 mg | ORAL_TABLET | Freq: Every day | ORAL | Status: DC

## 2013-02-17 MED ORDER — BENAZEPRIL HCL 20 MG PO TABS: 20.0000 mg | ORAL_TABLET | Freq: Two times a day (BID) | ORAL | Status: DC

## 2013-02-17 MED ORDER — OMEPRAZOLE 20 MG PO CPDR: 20.0000 mg | DELAYED_RELEASE_CAPSULE | Freq: Every day | ORAL | Status: DC

## 2013-02-17 NOTE — Patient Instructions (Signed)

## 2013-02-17 NOTE — Progress Notes (Signed)
  Subjective:    Patient ID: Yvette [name], female    DOB: 20-Sep-1950, 62 y.o.   MRN: [id number]  Hypertension This is a chronic problem. The current episode started more than 1 year ago. The problem is unchanged. The problem is controlled. Pertinent negatives include no chest pain, headaches, malaise/fatigue, orthopnea, palpitations, peripheral edema or shortness of breath. There are no associated agents to hypertension. There are no known risk factors for coronary artery disease. Past treatments include calcium channel blockers and ACE inhibitors. The current treatment provides moderate improvement. Compliance problems include diet.   GERD Omeprazole working well- keeps symptoms under control   Review of Systems  Constitutional: Negative for malaise/fatigue.  Respiratory: Negative for shortness of breath.   Cardiovascular: Negative for chest pain, palpitations and orthopnea.  Neurological: Negative for headaches.  All other systems reviewed and are negative.       Objective:   Physical Exam  Constitutional: She is oriented to person, place, and time. She appears well-developed and well-nourished.  HENT:  Nose: Nose normal.  Mouth/Throat: Oropharynx is clear and moist.  Eyes: EOM are normal.  Neck: Trachea normal, normal range of motion and full passive range of motion without pain. Neck supple. No JVD present. Carotid bruit is not present. No thyromegaly present.  Cardiovascular: Normal rate, regular rhythm, normal heart sounds and intact distal pulses.  Exam reveals no gallop and no friction rub.   No murmur heard. Pulmonary/Chest: Effort normal and breath sounds normal.  Abdominal: Soft. Bowel sounds are normal. She exhibits no distension and no mass. There is no tenderness.  Musculoskeletal: Normal range of motion.  Lymphadenopathy:    She has no cervical adenopathy.  Neurological: She is alert and oriented to person, place, and time. She has normal reflexes.  Skin: Skin is  warm and dry.  Psychiatric: She has a normal mood and affect. Her behavior is normal. Judgment and thought content normal.     BP 132/75  Pulse 64  Temp(Src) 98.1 F (36.7 C) (Oral)  Ht 5\' 4"  (1.626 m)  Wt 230 lb (104.327 kg)  BMI 39.46 kg/m2      Assessment & Plan:   1. Need for Tdap vaccination   2. Hypertension   3. GERD (gastroesophageal reflux disease)    Orders Placed This Encounter  Procedures  . Tdap vaccine greater than or equal to 7yo IM   Meds ordered this encounter  Medications  . DISCONTD: omeprazole (PRILOSEC) 20 MG capsule    Sig: Take 20 mg by mouth daily.  Marland Kitchen omeprazole (PRILOSEC) 20 MG capsule    Sig: Take 1 capsule (20 mg total) by mouth daily.    Dispense:  30 capsule    Refill:  5  . benazepril (LOTENSIN) 20 MG tablet    Sig: Take 1 tablet (20 mg total) by mouth 2 (two) times daily.    Dispense:  60 tablet    Refill:  5    Needs to be seen before next refill  . amLODipine (NORVASC) 10 MG tablet    Sig: Take 1 tablet (10 mg total) by mouth daily.    Dispense:  30 tablet    Refill:  5    Needs to be seen before next refill   Continue all meds Labs pending Diet and exercise encouraged Follow -up in 3 months  Mary-Margaret Daphine Deutscher, FNP

## 2013-02-18 LAB — COMPLETE METABOLIC PANEL WITH GFR
AST: 18 U/L (ref 0–37)
BUN: 16 mg/dL (ref 6–23)
Calcium: 10.3 mg/dL (ref 8.4–10.5)
Chloride: 104 mEq/L (ref 96–112)
Creat: 0.88 mg/dL (ref 0.50–1.10)
Total Bilirubin: 0.3 mg/dL (ref 0.3–1.2)

## 2013-02-18 LAB — NMR LIPOPROFILE WITH LIPIDS
Cholesterol, Total: 211 mg/dL — ABNORMAL HIGH (ref <200)
HDL Size: 8.7 nm — ABNORMAL LOW (ref >=9.2)
LDL (calc): 139 mg/dL — ABNORMAL HIGH (ref <100)
LDL Particle Number: 1800 nmol/L — ABNORMAL HIGH (ref <1000)
LDL Size: 20.7 nm (ref >20.5)
Large VLDL-P: 6.6 nmol/L — ABNORMAL HIGH (ref <=2.7)
Small LDL Particle Number: 602 nmol/L — ABNORMAL HIGH (ref <=527)
VLDL Size: 54.3 nm — ABNORMAL HIGH (ref <=46.6)

## 2013-03-08 ENCOUNTER — Telehealth: Payer: Self-pay | Admitting: Nurse Practitioner

## 2013-03-08 NOTE — Telephone Encounter (Signed)
Patient has appt at 10:45 in the am

## 2013-03-09 ENCOUNTER — Ambulatory Visit (INDEPENDENT_AMBULATORY_CARE_PROVIDER_SITE_OTHER): Payer: BC Managed Care – PPO | Admitting: Family Medicine

## 2013-03-09 ENCOUNTER — Encounter: Payer: Self-pay | Admitting: Family Medicine

## 2013-03-09 VITALS — BP 124/70 | HR 62 | Temp 98.3°F | Ht 64.0 in | Wt 232.0 lb

## 2013-03-09 DIAGNOSIS — L282 Other prurigo: Secondary | ICD-10-CM

## 2013-03-09 MED ORDER — HYDROXYZINE HCL 25 MG PO TABS: 25.0000 mg | ORAL_TABLET | Freq: Three times a day (TID) | ORAL | Status: DC | PRN

## 2013-03-09 MED ORDER — PREDNISONE 50 MG PO TABS: ORAL_TABLET | ORAL | Status: DC

## 2013-03-09 NOTE — Progress Notes (Signed)
  Subjective:    Patient ID: Yvette [name], female    DOB: [birth year], 62 y.o.   MRN: [id number]  HPI HPI  This patient complains of a RASH  Location: diffuse. Upper ext, lower ext, abdomen   Onset: 3-4 days   Course: had sudden onset of intensily pruritic rash over last 3-4 days.   Self-treated with: benadryl   Improvement with treatment: minimal   History  Itching: yes  Tenderness: no  New medications/antibiotics: no  Pet exposure: no Recent travel or tropical exposure: no  New soaps, shampoos, detergent, clothing: no  Tick/insect exposure: no  Chemical Exposure: no  Red Flags  Feeling ill: no  Fever: no  Facial/tongue swelling/difficulty breathing: no  Diabetic or immunocompromised: no      Review of Systems  All other systems reviewed and are negative.       Objective:   Physical Exam  Constitutional: She appears well-developed and well-nourished.  HENT:  Head: Normocephalic and atraumatic.  Eyes: Conjunctivae are normal. Pupils are equal, round, and reactive to light.  Neck: Normal range of motion.  Cardiovascular: Normal rate and regular rhythm.   Pulmonary/Chest: Effort normal.  Abdominal: Soft.  Musculoskeletal: Normal range of motion.  Neurological: She is alert.  Skin: Rash noted.         + blanching lesions. Non tender      Assessment & Plan:  Pruritic rash - Plan: predniSONE (DELTASONE) 50 MG tablet, hydrOXYzine (ATARAX/VISTARIL) 25 MG tablet  Relatively unclear etiology. Distribution not characteristic of fungal etiology.  Will place on prednisone and atarax for treatment.  Discussed general care and derm red flags.  Follow up with derm if sxs not improved.

## 2013-03-26 ENCOUNTER — Other Ambulatory Visit: Payer: Self-pay | Admitting: Nurse Practitioner

## 2013-06-24 ENCOUNTER — Other Ambulatory Visit: Payer: Self-pay

## 2013-08-31 ENCOUNTER — Other Ambulatory Visit: Payer: Self-pay

## 2013-08-31 MED ORDER — AMLODIPINE BESYLATE 10 MG PO TABS: 10.0000 mg | ORAL_TABLET | Freq: Every day | ORAL | Status: DC

## 2013-08-31 MED ORDER — BENAZEPRIL HCL 20 MG PO TABS: 20.0000 mg | ORAL_TABLET | Freq: Two times a day (BID) | ORAL | Status: DC

## 2013-08-31 NOTE — Telephone Encounter (Signed)
Last seen 03/09/13  Dr Newton 

## 2013-09-17 ENCOUNTER — Other Ambulatory Visit: Payer: Self-pay | Admitting: *Deleted

## 2013-09-17 MED ORDER — OMEPRAZOLE 20 MG PO CPDR: 20.0000 mg | DELAYED_RELEASE_CAPSULE | Freq: Every day | ORAL | Status: DC

## 2013-10-06 ENCOUNTER — Telehealth: Payer: Self-pay | Admitting: Nurse Practitioner

## 2013-10-06 NOTE — Telephone Encounter (Signed)
Duplicate message. 

## 2013-10-07 MED ORDER — AMLODIPINE BESYLATE 10 MG PO TABS: 10.0000 mg | ORAL_TABLET | Freq: Every day | ORAL | Status: DC

## 2013-10-07 MED ORDER — BENAZEPRIL HCL 20 MG PO TABS: 20.0000 mg | ORAL_TABLET | Freq: Two times a day (BID) | ORAL | Status: DC

## 2013-10-07 NOTE — Telephone Encounter (Signed)
rx sent to pharmacy- no more refills without being seen

## 2013-10-26 ENCOUNTER — Ambulatory Visit (INDEPENDENT_AMBULATORY_CARE_PROVIDER_SITE_OTHER): Payer: BC Managed Care – PPO | Admitting: Nurse Practitioner

## 2013-10-26 ENCOUNTER — Encounter: Payer: Self-pay | Admitting: Nurse Practitioner

## 2013-10-26 VITALS — BP 142/74 | HR 58 | Temp 97.8°F | Ht 64.0 in | Wt 233.4 lb

## 2013-10-26 DIAGNOSIS — I10 Essential (primary) hypertension: Secondary | ICD-10-CM

## 2013-10-26 DIAGNOSIS — Z1239 Encounter for other screening for malignant neoplasm of breast: Secondary | ICD-10-CM

## 2013-10-26 DIAGNOSIS — Z1211 Encounter for screening for malignant neoplasm of colon: Secondary | ICD-10-CM

## 2013-10-26 DIAGNOSIS — K219 Gastro-esophageal reflux disease without esophagitis: Secondary | ICD-10-CM

## 2013-10-26 MED ORDER — AMLODIPINE BESYLATE 10 MG PO TABS: 10.0000 mg | ORAL_TABLET | Freq: Every day | ORAL | Status: DC

## 2013-10-26 MED ORDER — BENAZEPRIL HCL 20 MG PO TABS: 20.0000 mg | ORAL_TABLET | Freq: Two times a day (BID) | ORAL | Status: DC

## 2013-10-26 MED ORDER — OMEPRAZOLE 20 MG PO CPDR: 20.0000 mg | DELAYED_RELEASE_CAPSULE | Freq: Every day | ORAL | Status: DC

## 2013-10-26 NOTE — Progress Notes (Signed)
Subjective:    Patient ID: Yvette [name], female    DOB: [birth year], 63 y.o.   MRN: [id number]  Sinusitis The current episode started yesterday. There has been no fever. Her pain is at a severity of 2/10. The pain is mild. Associated symptoms include congestion, headaches and sinus pressure. Pertinent negatives include no hoarse voice or shortness of breath. Treatments tried: Allegra  The treatment provided mild relief.  Hypertension This is a chronic problem. The current episode started more than 1 year ago. The problem has been gradually improving since onset. The problem is controlled. Associated symptoms include headaches. Pertinent negatives include no blurred vision, chest pain, palpitations, peripheral edema or shortness of breath. Risk factors for coronary artery disease include post-menopausal state. Past treatments include ACE inhibitors and calcium channel blockers. There are no compliance problems.   Gastrophageal Reflux She complains of heartburn (Only when aggravated by red meats or spicy food). She reports no chest pain or no hoarse voice. This is a chronic problem. The current episode started more than 1 year ago. The problem occurs rarely. The problem has been resolved. The heartburn is located in the right chest and left chest. The heartburn is of moderate intensity. The heartburn wakes her from sleep. The heartburn does not limit her activity. The heartburn changes with position. The symptoms are aggravated by certain foods and lying down. She has tried a PPI for the symptoms. The treatment provided significant relief. Had surgery to remove adhesions in esophagus .   Sleep Disturbance This has been going on for 2 years. Wakes up during the night daily. Has a strict nighttime routine..darkens the room and cuts off all electronics. Is not interested in medication at this time.     Review of Systems  HENT: Positive for congestion and sinus pressure. Negative for hoarse voice.     Eyes: Negative for blurred vision.  Respiratory: Negative for shortness of breath.   Cardiovascular: Negative for chest pain and palpitations.  Gastrointestinal: Positive for heartburn (Only when aggravated by red meats or spicy food). Negative for diarrhea and constipation.  Neurological: Positive for headaches.  Psychiatric/Behavioral: Positive for sleep disturbance.       Objective:   Physical Exam  Constitutional: She is oriented to person, place, and time. She appears well-developed and well-nourished.  HENT:  Nose: Nose normal.  Mouth/Throat: Oropharynx is clear and moist.  Eyes: EOM are normal.  Neck: Trachea normal, normal range of motion and full passive range of motion without pain. Neck supple. No JVD present. Carotid bruit is not present. No thyromegaly present.  Cardiovascular: Normal rate, regular rhythm, normal heart sounds and intact distal pulses.  Exam reveals no gallop and no friction rub.   No murmur heard. Pulmonary/Chest: Effort normal and breath sounds normal.  Abdominal: Soft. Bowel sounds are normal. She exhibits no distension and no mass. There is no tenderness.  Musculoskeletal: Normal range of motion.  Lymphadenopathy:    She has no cervical adenopathy.  Neurological: She is alert and oriented to person, place, and time. She has normal reflexes.  Skin: Skin is warm and dry.  Psychiatric: She has a normal mood and affect. Her behavior is normal. Judgment and thought content normal.   BP 142/74  Pulse 58  Temp(Src) 97.8 F (36.6 C) (Oral)  Ht 5' 4" (1.626 m)  Wt 233 lb 6.4 oz (105.87 kg)  BMI 40.04 kg/m2        Assessment & Plan:   1. Essential hypertension, benign  2. Encounter for screening colonoscopy   3. Screening for breast cancer   4. GERD (gastroesophageal reflux disease)    Orders Placed This Encounter  Procedures  . MM Digital Screening    Standing Status: Future     Number of Occurrences:      Standing Expiration Date:  12/27/2014    Order Specific Question:  Reason for Exam (SYMPTOM  OR DIAGNOSIS REQUIRED)    Answer:  Screening for Breast Cancer    Order Specific Question:  Preferred imaging location?    Answer:  External     Comments:  [facility]  . CMP14+EGFR  . NMR, lipoprofile  . Ambulatory referral to Gastroenterology    Referral Priority:  Routine    Referral Type:  Consultation    Referral Reason:  Specialty Services Required    Requested Specialty:  Gastroenterology    Number of Visits Requested:  1   Meds ordered this encounter  Medications  . amLODipine (NORVASC) 10 MG tablet    Sig: Take 1 tablet (10 mg total) by mouth daily.    Dispense:  30 tablet    Refill:  5    Order Specific Question:  Supervising Provider    Answer:  MOORE, DONALD W [[id number]]  . benazepril (LOTENSIN) 20 MG tablet    Sig: Take 1 tablet (20 mg total) by mouth 2 (two) times daily.    Dispense:  60 tablet    Refill:  5    Order Specific Question:  Supervising Provider    Answer:  MOORE, DONALD W [[id number]]  . omeprazole (PRILOSEC) 20 MG capsule    Sig: Take 1 capsule (20 mg total) by mouth daily.    Dispense:  30 capsule    Refill:  5    Order Specific Question:  Supervising Provider    Answer:  MOORE, DONALD W [[id number]]    Labs pending Health maintenance reviewed Diet and exercise encouraged Continue all meds Follow up  In 3 months   Mary-Margaret Martin, FNP    

## 2013-10-26 NOTE — Patient Instructions (Signed)

## 2013-10-28 LAB — CMP14+EGFR
ALT: 21 IU/L (ref 0–32)
AST: 19 IU/L (ref 0–40)
Albumin/Globulin Ratio: 2.1 (ref 1.1–2.5)
Albumin: 4.4 g/dL (ref 3.6–4.8)
Alkaline Phosphatase: 81 IU/L (ref 39–117)
BUN/Creatinine Ratio: 21 (ref 11–26)
BUN: 14 mg/dL (ref 8–27)
CHLORIDE: 103 mmol/L (ref 97–108)
CO2: 25 mmol/L (ref 18–29)
Calcium: 10 mg/dL (ref 8.7–10.3)
Creatinine, Ser: 0.68 mg/dL (ref 0.57–1.00)
GFR calc non Af Amer: 93 mL/min/{1.73_m2} (ref >59)
GFR, EST AFRICAN AMERICAN: 108 mL/min/{1.73_m2} (ref >59)
GLUCOSE: 85 mg/dL (ref 65–99)
Globulin, Total: 2.1 g/dL (ref 1.5–4.5)
Potassium: 4 mmol/L (ref 3.5–5.2)
Sodium: 141 mmol/L (ref 134–144)
TOTAL PROTEIN: 6.5 g/dL (ref 6.0–8.5)
Total Bilirubin: 0.3 mg/dL (ref 0.0–1.2)

## 2013-10-28 LAB — NMR, LIPOPROFILE
Cholesterol: 201 mg/dL — ABNORMAL HIGH (ref <200)
HDL Cholesterol by NMR: 49 mg/dL (ref >=40)
HDL Particle Number: 32.9 umol/L (ref >=30.5)
LDL PARTICLE NUMBER: 1686 nmol/L — AB (ref <1000)
LDL Size: 20.9 nm (ref >20.5)
LDLC SERPL CALC-MCNC: 124 mg/dL — ABNORMAL HIGH (ref <100)
LP-IR SCORE: 66 — AB (ref <=45)
Small LDL Particle Number: 658 nmol/L — ABNORMAL HIGH (ref <=527)
TRIGLYCERIDES BY NMR: 141 mg/dL (ref <150)

## 2013-11-29 ENCOUNTER — Encounter: Payer: Self-pay | Admitting: Nurse Practitioner

## 2013-12-02 ENCOUNTER — Telehealth: Payer: Self-pay | Admitting: Nurse Practitioner

## 2013-12-03 NOTE — Telephone Encounter (Signed)
Left message with results per patient's request. Asked her to call if she is interested in starting a statin to control cholesterol. Also left message that these results have been released to My Chart and that she can logon to view them.

## 2014-04-29 ENCOUNTER — Other Ambulatory Visit: Payer: Self-pay | Admitting: *Deleted

## 2014-04-29 MED ORDER — AMLODIPINE BESYLATE 10 MG PO TABS
10.0000 mg | ORAL_TABLET | Freq: Every day | ORAL | Status: DC
Start: 2014-04-29 — End: 2014-06-27

## 2014-04-29 MED ORDER — BENAZEPRIL HCL 20 MG PO TABS
20.0000 mg | ORAL_TABLET | Freq: Two times a day (BID) | ORAL | Status: DC
Start: 2014-04-29 — End: 2014-06-27

## 2014-04-29 MED ORDER — OMEPRAZOLE 20 MG PO CPDR: 20.0000 mg | DELAYED_RELEASE_CAPSULE | Freq: Every day | ORAL | Status: DC

## 2014-06-27 ENCOUNTER — Other Ambulatory Visit: Payer: Self-pay

## 2014-06-27 MED ORDER — OMEPRAZOLE 20 MG PO CPDR: 20.0000 mg | DELAYED_RELEASE_CAPSULE | Freq: Every day | ORAL | Status: DC

## 2014-06-27 MED ORDER — AMLODIPINE BESYLATE 10 MG PO TABS: 10.0000 mg | ORAL_TABLET | Freq: Every day | ORAL | Status: DC

## 2014-06-27 MED ORDER — BENAZEPRIL HCL 20 MG PO TABS: 20.0000 mg | ORAL_TABLET | Freq: Two times a day (BID) | ORAL | Status: DC

## 2014-07-18 ENCOUNTER — Telehealth: Payer: Self-pay | Admitting: Nurse Practitioner

## 2014-07-18 NOTE — Telephone Encounter (Signed)
Appt made and patient notified 

## 2014-07-19 ENCOUNTER — Ambulatory Visit (INDEPENDENT_AMBULATORY_CARE_PROVIDER_SITE_OTHER): Payer: BC Managed Care – PPO | Admitting: Family Medicine

## 2014-07-19 ENCOUNTER — Encounter: Payer: Self-pay | Admitting: Family Medicine

## 2014-07-19 VITALS — BP 146/75 | HR 71 | Temp 97.5°F | Ht 64.0 in | Wt 229.0 lb

## 2014-07-19 DIAGNOSIS — J069 Acute upper respiratory infection, unspecified: Secondary | ICD-10-CM

## 2014-07-19 MED ORDER — AMLODIPINE BESYLATE 10 MG PO TABS: 10.0000 mg | ORAL_TABLET | Freq: Every day | ORAL | Status: DC

## 2014-07-19 MED ORDER — AZITHROMYCIN 250 MG PO TABS: ORAL_TABLET | ORAL | Status: DC

## 2014-07-19 MED ORDER — OMEPRAZOLE 20 MG PO CPDR: 20.0000 mg | DELAYED_RELEASE_CAPSULE | Freq: Every day | ORAL | Status: DC

## 2014-07-19 MED ORDER — BENAZEPRIL HCL 20 MG PO TABS: 20.0000 mg | ORAL_TABLET | Freq: Two times a day (BID) | ORAL | Status: DC

## 2014-07-19 MED ORDER — METHYLPREDNISOLONE ACETATE 80 MG/ML IJ SUSP
80.0000 mg | Freq: Once | INTRAMUSCULAR | Status: AC
  Administered 2014-07-19: 80 mg via INTRAMUSCULAR

## 2014-07-19 NOTE — Addendum Note (Signed)
Addended by: Marin Olp on: 07/19/2014 05:18 PM   Modules accepted: Orders, Medications, SmartSet

## 2014-07-19 NOTE — Progress Notes (Signed)
   Subjective:    Patient ID: Yvette [name], female    DOB: [birth year], 63 y.o.   MRN: [id number]  HPI C/o uri sx's and cough.  She is having some sinus congestion.  Review of Systems  Constitutional: Negative for fever.  HENT: Negative for ear pain.   Eyes: Negative for discharge.  Respiratory: Negative for cough.   Cardiovascular: Negative for chest pain.  Gastrointestinal: Negative for abdominal distention.  Endocrine: Negative for polyuria.  Genitourinary: Negative for difficulty urinating.  Musculoskeletal: Negative for gait problem and neck pain.  Skin: Negative for color change and rash.  Neurological: Negative for speech difficulty and headaches.  Psychiatric/Behavioral: Negative for agitation.       Objective:    BP 146/75 mmHg  Pulse 71  Temp(Src) 97.5 F (36.4 C) (Oral)  Ht 5\' 4"  (1.626 m)  Wt 229 lb (103.874 kg)  BMI 39.29 kg/m2 Physical Exam  Constitutional: She is oriented to person, place, and time. She appears well-developed and well-nourished.  HENT:  Head: Normocephalic and atraumatic.  Mouth/Throat: Oropharynx is clear and moist.  Eyes: Pupils are equal, round, and reactive to light.  Neck: Normal range of motion. Neck supple.  Cardiovascular: Normal rate and regular rhythm.   No murmur heard. Pulmonary/Chest: Effort normal and breath sounds normal.  Abdominal: Soft. Bowel sounds are normal. There is no tenderness.  Neurological: She is alert and oriented to person, place, and time.  Skin: Skin is warm and dry.  Psychiatric: She has a normal mood and affect.          Assessment & Plan:     ICD-9-CM ICD-10-CM   1. URI (upper respiratory infection) 465.9 J06.9 azithromycin (ZITHROMAX) 250 MG tablet     methylPREDNISolone acetate (DEPO-MEDROL) injection 80 mg     Return if symptoms worsen or fail to improve.  Lysbeth Penner FNP

## 2014-08-30 ENCOUNTER — Telehealth: Payer: Self-pay | Admitting: Nurse Practitioner

## 2014-08-31 ENCOUNTER — Other Ambulatory Visit: Payer: Self-pay

## 2014-08-31 MED ORDER — AMLODIPINE BESYLATE 10 MG PO TABS: 10.0000 mg | ORAL_TABLET | Freq: Every day | ORAL | Status: DC

## 2014-08-31 MED ORDER — BENAZEPRIL HCL 20 MG PO TABS
20.0000 mg | ORAL_TABLET | Freq: Two times a day (BID) | ORAL | Status: DC
Start: 2014-08-31 — End: 2014-09-26

## 2014-09-26 ENCOUNTER — Encounter: Payer: Self-pay | Admitting: Nurse Practitioner

## 2014-09-26 ENCOUNTER — Ambulatory Visit (INDEPENDENT_AMBULATORY_CARE_PROVIDER_SITE_OTHER): Payer: BC Managed Care – PPO | Admitting: Nurse Practitioner

## 2014-09-26 VITALS — BP 160/85 | HR 65 | Temp 97.1°F | Ht 64.0 in | Wt 231.0 lb

## 2014-09-26 DIAGNOSIS — K219 Gastro-esophageal reflux disease without esophagitis: Secondary | ICD-10-CM

## 2014-09-26 DIAGNOSIS — I10 Essential (primary) hypertension: Secondary | ICD-10-CM

## 2014-09-26 MED ORDER — AMLODIPINE BESYLATE 10 MG PO TABS: 10.0000 mg | ORAL_TABLET | Freq: Every day | ORAL | Status: DC

## 2014-09-26 MED ORDER — BENAZEPRIL HCL 20 MG PO TABS: 20.0000 mg | ORAL_TABLET | Freq: Two times a day (BID) | ORAL | Status: DC

## 2014-09-26 MED ORDER — OMEPRAZOLE 20 MG PO CPDR: 20.0000 mg | DELAYED_RELEASE_CAPSULE | Freq: Every day | ORAL | Status: DC

## 2014-09-26 NOTE — Progress Notes (Signed)
  Subjective:    Patient ID: Yvette [name], female    DOB: 1951-08-16, 64 y.o.   MRN: [id number]  Patient is here for chronic disease follow up. No acute complaint.   Hypertension This is a chronic problem. The current episode started more than 1 year ago. The problem is resistant. Pertinent negatives include no chest pain, headaches, palpitations or shortness of breath. Risk factors for coronary artery disease include post-menopausal state. Past treatments include ACE inhibitors and angiotensin blockers. There are no compliance problems.   GERD Omeprazole working well- keeps symptoms under control   Review of Systems  Constitutional: Negative.   HENT: Negative.   Eyes: Negative.   Respiratory: Negative.  Negative for shortness of breath.   Cardiovascular: Negative.  Negative for chest pain and palpitations.  Endocrine: Negative.   Genitourinary: Negative.   Musculoskeletal: Negative.   Allergic/Immunologic: Negative.   Neurological: Negative.  Negative for headaches.  Hematological: Negative.   Psychiatric/Behavioral: Negative.   All other systems reviewed and are negative.      Objective:   Physical Exam  Constitutional: She is oriented to person, place, and time. She appears well-developed and well-nourished.  HENT:  Nose: Nose normal.  Mouth/Throat: Oropharynx is clear and moist.  Eyes: EOM are normal.  Neck: Trachea normal, normal range of motion and full passive range of motion without pain. Neck supple. No JVD present. Carotid bruit is not present. No thyromegaly present.  Cardiovascular: Normal rate, regular rhythm, normal heart sounds and intact distal pulses.  Exam reveals no gallop and no friction rub.   No murmur heard. Pulmonary/Chest: Effort normal and breath sounds normal.  Abdominal: Soft. Bowel sounds are normal. She exhibits no distension and no mass. There is no tenderness.  Musculoskeletal: Normal range of motion.  Lymphadenopathy:    She has no cervical  adenopathy.  Neurological: She is alert and oriented to person, place, and time. She has normal reflexes.  Skin: Skin is warm and dry.  Psychiatric: She has a normal mood and affect. Her behavior is normal. Judgment and thought content normal.     BP 160/85 mmHg  Pulse 65  Temp(Src) 97.1 F (36.2 C) (Oral)  Ht $R'5\' 4"'uQ$  (1.626 m)  Wt 231 lb (104.781 kg)  BMI 39.63 kg/m2      Assessment & Plan:   1. Essential hypertension Do not add salt to diet - CMP14+EGFR - NMR, lipoprofile - amLODipine (NORVASC) 10 MG tablet; Take 1 tablet (10 mg total) by mouth daily.  Dispense: 30 tablet; Refill: 3 - benazepril (LOTENSIN) 20 MG tablet; Take 1 tablet (20 mg total) by mouth 2 (two) times daily.  Dispense: 60 tablet; Refill: 3  2. Gastroesophageal reflux disease without esophagitis Avoid spicy foods Do not eat 2 hours prior to bedtime - omeprazole (PRILOSEC) 20 MG capsule; Take 1 capsule (20 mg total) by mouth daily.  Dispense: 30 capsule; Refill: 3    Labs pending Health maintenance reviewed Diet and exercise encouraged Continue all meds Follow up  In 6 months   [place], FNP

## 2014-09-26 NOTE — Patient Instructions (Signed)

## 2014-09-27 LAB — CMP14+EGFR
A/G RATIO: 2.2 (ref 1.1–2.5)
ALK PHOS: 82 IU/L (ref 39–117)
ALT: 19 IU/L (ref 0–32)
AST: 17 IU/L (ref 0–40)
Albumin: 4.8 g/dL (ref 3.6–4.8)
BUN / CREAT RATIO: 21 (ref 11–26)
BUN: 13 mg/dL (ref 8–27)
Bilirubin Total: <0.2 mg/dL (ref 0.0–1.2)
CHLORIDE: 105 mmol/L (ref 97–108)
CO2: 23 mmol/L (ref 18–29)
Calcium: 9.8 mg/dL (ref 8.7–10.3)
Creatinine, Ser: 0.62 mg/dL (ref 0.57–1.00)
GFR calc Af Amer: 110 mL/min/{1.73_m2} (ref >59)
GFR calc non Af Amer: 96 mL/min/{1.73_m2} (ref >59)
Globulin, Total: 2.2 g/dL (ref 1.5–4.5)
Glucose: 84 mg/dL (ref 65–99)
POTASSIUM: 4 mmol/L (ref 3.5–5.2)
SODIUM: 143 mmol/L (ref 134–144)
TOTAL PROTEIN: 7 g/dL (ref 6.0–8.5)

## 2014-09-27 LAB — NMR, LIPOPROFILE
CHOLESTEROL: 195 mg/dL (ref 100–199)
HDL Cholesterol by NMR: 51 mg/dL (ref >39)
HDL Particle Number: 41 umol/L (ref >=30.5)
LDL PARTICLE NUMBER: 1656 nmol/L — AB (ref <1000)
LDL SIZE: 20.4 nm (ref >20.5)
LDL-C: 114 mg/dL — ABNORMAL HIGH (ref 0–99)
LP-IR SCORE: 84 — AB (ref <=45)
Small LDL Particle Number: 765 nmol/L — ABNORMAL HIGH (ref <=527)
Triglycerides by NMR: 151 mg/dL — ABNORMAL HIGH (ref 0–149)

## 2014-09-29 ENCOUNTER — Telehealth: Payer: Self-pay | Admitting: *Deleted

## 2014-09-29 NOTE — Telephone Encounter (Signed)
lmtcb regarding test results. 

## 2014-09-29 NOTE — Telephone Encounter (Signed)
-----   Message from [facility], [place] sent at 09/28/2014 12:47 PM EST ----- Kidney and liver function stable LDL particle numbers and LDL no change since previous labs- needs to be on statin- will agree to take? Continue current meds- low fat diet and exercise and recheck in 3 months

## 2014-09-30 NOTE — Telephone Encounter (Signed)
-----   Message from [facility], [place] sent at 09/28/2014 12:47 PM EST ----- Kidney and liver function stable LDL particle numbers and LDL no change since previous labs- needs to be on statin- will agree to take? Continue current meds- low fat diet and exercise and recheck in 3 months

## 2014-09-30 NOTE — Telephone Encounter (Signed)
Ok

## 2014-09-30 NOTE — Telephone Encounter (Signed)
Pt aware of lab results and wanted to let MMM know she does not want to be on statin.  She will watch diet and rck in 3 months

## 2014-11-24 ENCOUNTER — Other Ambulatory Visit: Payer: Self-pay | Admitting: Nurse Practitioner

## 2014-11-24 ENCOUNTER — Telehealth: Payer: Self-pay | Admitting: *Deleted

## 2014-11-24 DIAGNOSIS — I10 Essential (primary) hypertension: Secondary | ICD-10-CM

## 2014-11-24 DIAGNOSIS — K219 Gastro-esophageal reflux disease without esophagitis: Secondary | ICD-10-CM

## 2014-11-24 MED ORDER — BENAZEPRIL HCL 20 MG PO TABS: 20.0000 mg | ORAL_TABLET | Freq: Two times a day (BID) | ORAL | Status: DC

## 2014-11-24 MED ORDER — OMEPRAZOLE 20 MG PO CPDR: 20.0000 mg | DELAYED_RELEASE_CAPSULE | Freq: Every day | ORAL | Status: DC

## 2014-11-24 MED ORDER — AMLODIPINE BESYLATE 10 MG PO TABS: 10.0000 mg | ORAL_TABLET | Freq: Every day | ORAL | Status: DC

## 2014-11-24 NOTE — Telephone Encounter (Signed)
Refills sent to pharmacy. Last visit was 09/2014

## 2014-11-24 NOTE — Telephone Encounter (Signed)
done

## 2015-03-01 ENCOUNTER — Telehealth: Payer: Self-pay | Admitting: Nurse Practitioner

## 2015-03-01 NOTE — Telephone Encounter (Signed)
done

## 2015-03-06 ENCOUNTER — Telehealth: Payer: Self-pay | Admitting: Nurse Practitioner

## 2015-03-06 DIAGNOSIS — I10 Essential (primary) hypertension: Secondary | ICD-10-CM

## 2015-03-06 MED ORDER — BENAZEPRIL HCL 20 MG PO TABS: 20.0000 mg | ORAL_TABLET | Freq: Two times a day (BID) | ORAL | Status: DC

## 2015-03-06 MED ORDER — AMLODIPINE BESYLATE 10 MG PO TABS: 10.0000 mg | ORAL_TABLET | Freq: Every day | ORAL | Status: DC

## 2015-03-06 NOTE — Telephone Encounter (Signed)
Patient aware that she mmm note says every 6 mths and she will be due to be seen in August. Refill given on her BP medications.

## 2015-03-16 ENCOUNTER — Encounter: Payer: Self-pay | Admitting: Nurse Practitioner

## 2015-03-16 ENCOUNTER — Encounter (INDEPENDENT_AMBULATORY_CARE_PROVIDER_SITE_OTHER): Payer: Self-pay

## 2015-03-16 ENCOUNTER — Telehealth: Payer: Self-pay | Admitting: Nurse Practitioner

## 2015-03-16 ENCOUNTER — Ambulatory Visit (INDEPENDENT_AMBULATORY_CARE_PROVIDER_SITE_OTHER): Payer: BC Managed Care – PPO | Admitting: Nurse Practitioner

## 2015-03-16 VITALS — BP 136/82 | HR 56 | Temp 97.5°F | Ht 64.0 in | Wt 221.0 lb

## 2015-03-16 DIAGNOSIS — I10 Essential (primary) hypertension: Secondary | ICD-10-CM

## 2015-03-16 DIAGNOSIS — M1812 Unilateral primary osteoarthritis of first carpometacarpal joint, left hand: Secondary | ICD-10-CM

## 2015-03-16 DIAGNOSIS — K219 Gastro-esophageal reflux disease without esophagitis: Secondary | ICD-10-CM

## 2015-03-16 DIAGNOSIS — M19042 Primary osteoarthritis, left hand: Secondary | ICD-10-CM | POA: Diagnosis not present

## 2015-03-16 MED ORDER — BENAZEPRIL HCL 20 MG PO TABS: 20.0000 mg | ORAL_TABLET | Freq: Two times a day (BID) | ORAL | Status: DC

## 2015-03-16 MED ORDER — MELOXICAM 15 MG PO TABS: 15.0000 mg | ORAL_TABLET | Freq: Every day | ORAL | Status: DC

## 2015-03-16 MED ORDER — AMLODIPINE BESYLATE 10 MG PO TABS: 10.0000 mg | ORAL_TABLET | Freq: Every day | ORAL | Status: DC

## 2015-03-16 MED ORDER — OMEPRAZOLE 20 MG PO CPDR: 20.0000 mg | DELAYED_RELEASE_CAPSULE | Freq: Every day | ORAL | Status: DC

## 2015-03-16 MED ORDER — DICLOFENAC SODIUM 1 % TD GEL: 4.0000 g | Freq: Four times a day (QID) | TRANSDERMAL | Status: DC

## 2015-03-16 NOTE — Progress Notes (Signed)
  Subjective:    Patient ID: Yvette [name], female    DOB: November 05, 1950, 64 y.o.   MRN: [id number]  Patient is here for chronic disease follow up. No acute complaint.   Hypertension This is a chronic problem. The current episode started more than 1 year ago. The problem is resistant. Pertinent negatives include no chest pain, headaches, palpitations or shortness of breath. Risk factors for coronary artery disease include post-menopausal state. Past treatments include ACE inhibitors and angiotensin blockers. There are no compliance problems.   GERD Omeprazole working well- keeps symptoms under control  * pain in left thumb- sharp shooting intermittent pain- radiates down into wrist  Review of Systems  Constitutional: Negative.   HENT: Negative.   Eyes: Negative.   Respiratory: Negative.  Negative for shortness of breath.   Cardiovascular: Negative.  Negative for chest pain and palpitations.  Endocrine: Negative.   Genitourinary: Negative.   Musculoskeletal: Negative.   Allergic/Immunologic: Negative.   Neurological: Negative.  Negative for headaches.  Hematological: Negative.   Psychiatric/Behavioral: Negative.   All other systems reviewed and are negative.      Objective:   Physical Exam  Constitutional: She is oriented to person, place, and time. She appears well-developed and well-nourished.  HENT:  Nose: Nose normal.  Mouth/Throat: Oropharynx is clear and moist.  Eyes: EOM are normal.  Neck: Trachea normal, normal range of motion and full passive range of motion without pain. Neck supple. No JVD present. Carotid bruit is not present. No thyromegaly present.  Cardiovascular: Normal rate, regular rhythm, normal heart sounds and intact distal pulses.  Exam reveals no gallop and no friction rub.   No murmur heard. Pulmonary/Chest: Effort normal and breath sounds normal.  Abdominal: Soft. Bowel sounds are normal. She exhibits no distension and no mass. There is no tenderness.   Musculoskeletal: Normal range of motion.  Left thumb base edematous and pain with moverment in any direction.  Lymphadenopathy:    She has no cervical adenopathy.  Neurological: She is alert and oriented to person, place, and time. She has normal reflexes.  Skin: Skin is warm and dry.  Psychiatric: She has a normal mood and affect. Her behavior is normal. Judgment and thought content normal.    BP 136/82 mmHg  Pulse 56  Temp(Src) 97.5 F (36.4 C) (Oral)  Ht $R'5\' 4"'fg$  (1.626 m)  Wt 221 lb (100.245 kg)  BMI 37.92 kg/m2        Assessment & Plan:   1. Essential hypertension Do not add salt to diet - benazepril (LOTENSIN) 20 MG tablet; Take 1 tablet (20 mg total) by mouth 2 (two) times daily.  Dispense: 180 tablet; Refill: 0 - amLODipine (NORVASC) 10 MG tablet; Take 1 tablet (10 mg total) by mouth daily.  Dispense: 90 tablet; Refill: 0 - CMP14+EGFR - Lipid panel  2. Gastroesophageal reflux disease without esophagitis Avoid spicy foods Do not eat 2 hours prior to bedtime - omeprazole (PRILOSEC) 20 MG capsule; Take 1 capsule (20 mg total) by mouth daily.  Dispense: 90 capsule; Refill: 0  3. Severe obesity (BMI >= 40) Discussed diet and exercise for person with BMI >25 Will recheck weight in 3-6 months   4. Degenerative arthritis of thumb, left mobic daily as rx Soak in epsom salt    Labs pending Health maintenance reviewed Diet and exercise encouraged Continue all meds Follow up  In 3 month   [place], FNP

## 2015-03-16 NOTE — Patient Instructions (Signed)
Exercise to Stay Healthy Exercise helps you become and stay healthy. EXERCISE IDEAS AND TIPS Choose exercises that:  You enjoy.  Fit into your day. You do not need to exercise really hard to be healthy. You can do exercises at a slow or medium level and stay healthy. You can:  Stretch before and after working out.  Try yoga, Pilates, or tai chi.  Lift weights.  Walk fast, swim, jog, run, climb stairs, bicycle, dance, or rollerskate.  Take aerobic classes. Exercises that burn about 150 calories:  Running 1  miles in 15 minutes.  Playing volleyball for 45 to 60 minutes.  Washing and waxing a car for 45 to 60 minutes.  Playing touch football for 45 minutes.  Walking 1  miles in 35 minutes.  Pushing a stroller 1  miles in 30 minutes.  Playing basketball for 30 minutes.  Raking leaves for 30 minutes.  Bicycling 5 miles in 30 minutes.  Walking 2 miles in 30 minutes.  Dancing for 30 minutes.  Shoveling snow for 15 minutes.  Swimming laps for 20 minutes.  Walking up stairs for 15 minutes.  Bicycling 4 miles in 15 minutes.  Gardening for 30 to 45 minutes.  Jumping rope for 15 minutes.  Washing windows or floors for 45 to 60 minutes. Document Released: 09/07/2010 Document Revised: 10/28/2011 Document Reviewed: 09/07/2010 Exit[employer] Patient Information 2015 [employer]. This information is not intended to replace advice given to you by your health care provider. Make sure you discuss any questions you have with your health care provider.  

## 2015-03-16 NOTE — Telephone Encounter (Signed)
Please advise 

## 2015-03-16 NOTE — Telephone Encounter (Signed)
Detailed message left that rx has been sent to pharmacy.

## 2015-03-16 NOTE — Telephone Encounter (Signed)
voltaren gel rx sent to pharmacy. 

## 2015-03-17 LAB — LIPID PANEL
CHOLESTEROL TOTAL: 212 mg/dL — AB (ref 100–199)
Chol/HDL Ratio: 4.2 ratio units (ref 0.0–4.4)
HDL: 50 mg/dL (ref >39)
LDL CALC: 137 mg/dL — AB (ref 0–99)
Triglycerides: 126 mg/dL (ref 0–149)
VLDL CHOLESTEROL CAL: 25 mg/dL (ref 5–40)

## 2015-03-17 LAB — CMP14+EGFR
A/G RATIO: 2.1 (ref 1.1–2.5)
ALK PHOS: 86 IU/L (ref 39–117)
ALT: 11 IU/L (ref 0–32)
AST: 14 IU/L (ref 0–40)
Albumin: 4.5 g/dL (ref 3.6–4.8)
BILIRUBIN TOTAL: 0.2 mg/dL (ref 0.0–1.2)
BUN/Creatinine Ratio: 14 (ref 11–26)
BUN: 12 mg/dL (ref 8–27)
CHLORIDE: 102 mmol/L (ref 97–108)
CO2: 22 mmol/L (ref 18–29)
Calcium: 10.1 mg/dL (ref 8.7–10.3)
Creatinine, Ser: 0.88 mg/dL (ref 0.57–1.00)
GFR calc Af Amer: 80 mL/min/{1.73_m2} (ref >59)
GFR, EST NON AFRICAN AMERICAN: 70 mL/min/{1.73_m2} (ref >59)
Globulin, Total: 2.1 g/dL (ref 1.5–4.5)
Glucose: 93 mg/dL (ref 65–99)
POTASSIUM: 4.6 mmol/L (ref 3.5–5.2)
Sodium: 141 mmol/L (ref 134–144)
Total Protein: 6.6 g/dL (ref 6.0–8.5)

## 2015-03-21 ENCOUNTER — Telehealth: Payer: Self-pay

## 2015-03-21 NOTE — Telephone Encounter (Signed)
Voltaren 1% gel was authorized by [employer]

## 2015-04-20 ENCOUNTER — Ambulatory Visit: Payer: BC Managed Care – PPO | Admitting: Nurse Practitioner

## 2015-05-26 ENCOUNTER — Ambulatory Visit (INDEPENDENT_AMBULATORY_CARE_PROVIDER_SITE_OTHER): Payer: BC Managed Care – PPO | Admitting: Nurse Practitioner

## 2015-05-26 ENCOUNTER — Encounter: Payer: Self-pay | Admitting: Nurse Practitioner

## 2015-05-26 DIAGNOSIS — E785 Hyperlipidemia, unspecified: Secondary | ICD-10-CM | POA: Diagnosis not present

## 2015-05-26 DIAGNOSIS — R0989 Other specified symptoms and signs involving the circulatory and respiratory systems: Secondary | ICD-10-CM

## 2015-05-26 DIAGNOSIS — I10 Essential (primary) hypertension: Secondary | ICD-10-CM | POA: Diagnosis not present

## 2015-05-26 DIAGNOSIS — Z23 Encounter for immunization: Secondary | ICD-10-CM

## 2015-05-26 DIAGNOSIS — K219 Gastro-esophageal reflux disease without esophagitis: Secondary | ICD-10-CM | POA: Diagnosis not present

## 2015-05-26 MED ORDER — MELOXICAM 15 MG PO TABS: 15.0000 mg | ORAL_TABLET | Freq: Every day | ORAL | Status: DC

## 2015-05-26 MED ORDER — AMLODIPINE BESYLATE 10 MG PO TABS: 10.0000 mg | ORAL_TABLET | Freq: Every day | ORAL | Status: DC

## 2015-05-26 MED ORDER — OMEPRAZOLE 20 MG PO CPDR: 20.0000 mg | DELAYED_RELEASE_CAPSULE | Freq: Every day | ORAL | Status: DC

## 2015-05-26 MED ORDER — BENAZEPRIL HCL 20 MG PO TABS: 20.0000 mg | ORAL_TABLET | Freq: Two times a day (BID) | ORAL | Status: DC

## 2015-05-26 NOTE — Patient Instructions (Signed)
°Carotid Artery Disease °The carotid arteries are the two main arteries on either side of the neck that supply blood to the brain. Carotid artery disease, also called carotid artery stenosis, is the narrowing or blockage of one or both carotid arteries. Carotid artery disease increases your risk for a stroke or a transient ischemic attack (TIA). A TIA is an episode in which a waxy, fatty substance that accumulates within the artery (plaque) blocks blood flow to the brain. A TIA is considered a "warning stroke."  °CAUSES  °· Buildup of plaque inside the carotid arteries (atherosclerosis) (common). °· A weakened outpouching in an artery (aneurysm). °· Inflammation of the carotid artery (arteritis). °· A fibrous growth within the carotid artery (fibromuscular dysplasia). °· Tissue death within the carotid artery due to radiation treatment (post-radiation necrosis). °· Decreased blood flow due to spasms of the carotid artery (vasospasm). °· Separation of the walls of the carotid artery (carotid dissection). °RISK FACTORS °· High cholesterol (dyslipidemia).   °· High blood pressure (hypertension).   °· Smoking.   °· Obesity.   °· Diabetes.   °· Family history of cardiovascular disease.   °· Inactivity or lack of regular exercise.   °· Being female. Men have an increased risk of developing atherosclerosis earlier in life than women.   °SYMPTOMS  °Carotid artery disease does not cause symptoms. °DIAGNOSIS °Diagnosis of carotid artery disease may include:  °· A physical exam. Your health care provider may hear an abnormal sound (bruit) when listening to the carotid arteries.   °· Specific tests that look at the blood flow in the carotid arteries. These tests include:   °¨ Carotid artery ultrasonography.   °¨ Carotid or cerebral angiography.   °¨ Computerized tomographic angiography (CTA).   °¨ Magnetic resonance angiography (MRA).   °TREATMENT  °Treatment of carotid artery disease can include a combination of treatments.  Treatment options include: °· Surgery. You may have:   °¨ A carotid endarterectomy. This is a surgery to remove the blockages in the carotid arteries.   °¨ A carotid angioplasty with stenting. This is a nonsurgical interventional procedure. A wire mesh (stent) is used to widen the blocked carotid arteries.   °· Medicines to control blood pressure, cholesterol, and reduce blood clotting (antiplatelet therapy).   °· Adjusting your diet.   °· Lifestyle changes such as:   °¨ Quitting smoking.   °¨ Exercising as tolerated or as directed by your health care provider.   °¨ Controlling and maintaining a good blood pressure.   °¨ Keeping cholesterol levels under control.   °HOME CARE INSTRUCTIONS  °· Take medicines only as directed by your health care provider. Make sure you understand all your medicine instructions. Do not stop your medicines without talking to your health care provider.   °· Follow your health care provider's diet instructions. It is important to eat a healthy diet that is low in saturated fats and includes plenty of fresh fruits, vegetables, and lean meats. High-fat, high-sodium foods as well as foods that are fried, overly processed, or have poor nutritional value should be avoided. °· Maintain a healthy weight.   °· Stay physically active. It is recommended that you get at least 30 minutes of activity every day.   °· Do not use any tobacco products including cigarettes, chewing tobacco, or electronic cigarettes. If you need help quitting, ask your health care provider. °· Limit alcohol use to:   °¨ No more than 2 drinks per day for men.   °¨ No more than 1 drink per day for nonpregnant women.   °· Do not use illegal drugs.   °· Keep all follow-up visits as directed by your health   care provider.   °SEEK IMMEDIATE MEDICAL CARE IF:  °You develop TIA or stroke symptoms. These include:  °· Sudden weakness or numbness on one side of the body, such as in the face, arm, or leg.   °· Sudden confusion.    °· Trouble speaking (aphasia) or understanding.   °· Sudden trouble seeing out of one or both eyes.   °· Sudden trouble walking.   °· Dizziness or feeling like you might faint.   °· Loss of balance or coordination.   °· Sudden severe headache with no known cause.   °· Sudden trouble swallowing (dysphagia).   °If you have any of these symptoms, call your local emergency services (911 in [place] Do not drive yourself to the clinic or hospital. This is a medical emergency.  °  °This information is not intended to replace advice given to you by your health care provider. Make sure you discuss any questions you have with your health care provider. °  °Document Released: 10/28/2011 Document Revised: 08/26/2014 Document Reviewed: 02/03/2013 °Elsevier Interactive Patient Education ©2016 Elsevier Inc. ° °

## 2015-05-26 NOTE — Progress Notes (Signed)
Subjective:    Patient ID: Yvette [name], female    DOB: [birth year], 64 y.o.   MRN: [id number]  Patient is here for chronic disease follow up. No acute complaint.   Hypertension This is a chronic problem. The current episode started more than 1 year ago. The problem is resistant. Pertinent negatives include no chest pain, headaches, palpitations or shortness of breath. Risk factors for coronary artery disease include post-menopausal state. Past treatments include ACE inhibitors and angiotensin blockers. There are no compliance problems.   Hyperlipidemia This is a chronic problem. This is a new diagnosis. The problem is uncontrolled. Recent lipid tests were reviewed and are variable. Pertinent negatives include no chest pain or shortness of breath. She is currently on no antihyperlipidemic treatment. Compliance problems include adherence to diet and adherence to exercise.  Risk factors for coronary artery disease include dyslipidemia, hypertension, obesity and post-menopausal.  GERD Omeprazole working well- keeps symptoms under control   Review of Systems  Constitutional: Negative.   HENT: Negative.   Eyes: Negative.   Respiratory: Negative.  Negative for shortness of breath.   Cardiovascular: Negative.  Negative for chest pain and palpitations.  Endocrine: Negative.   Genitourinary: Negative.   Musculoskeletal: Negative.   Allergic/Immunologic: Negative.   Neurological: Negative.  Negative for headaches.  Hematological: Negative.   Psychiatric/Behavioral: Negative.   All other systems reviewed and are negative.      Objective:   Physical Exam  Constitutional: She is oriented to person, place, and time. She appears well-developed and well-nourished.  HENT:  Nose: Nose normal.  Mouth/Throat: Oropharynx is clear and moist.  Eyes: EOM are normal.  Neck: Trachea normal, normal range of motion and full passive range of motion without pain. Neck supple. No JVD present. Carotid bruit  is not present. No thyromegaly present.  Cardiovascular: Normal rate, regular rhythm, normal heart sounds and intact distal pulses.  Exam reveals no gallop and no friction rub.   No murmur heard. Left carotid bruit   Pulmonary/Chest: Effort normal and breath sounds normal.  Abdominal: Soft. Bowel sounds are normal. She exhibits no distension and no mass. There is no tenderness.  Musculoskeletal: Normal range of motion.  Lymphadenopathy:    She has no cervical adenopathy.  Neurological: She is alert and oriented to person, place, and time. She has normal reflexes.  Skin: Skin is warm and dry.  Psychiatric: She has a normal mood and affect. Her behavior is normal. Judgment and thought content normal.   BP 163/80 mmHg  Pulse 67  Temp(Src) 97 F (36.1 C) (Oral)  Ht '5\' 4"'  (1.626 m)  Wt 223 lb (101.152 kg)  BMI 38.26 kg/m2       Assessment & Plan:   1. Morbid obesity, unspecified obesity type ([place])  2. Gastroesophageal reflux disease without esophagitis Avoid spicy foods Do not eat 2 hours prior to bedtime - omeprazole (PRILOSEC) 20 MG capsule; Take 1 capsule (20 mg total) by mouth daily.  Dispense: 90 capsule; Refill: 1  3. Essential hypertension Do not add salt to diet No change in blood pressure meds - blood pressure 341'P systolic at home- keep diary of blood pressure to bring to next appointment - CMP14+EGFR - benazepril (LOTENSIN) 20 MG tablet; Take 1 tablet (20 mg total) by mouth 2 (two) times daily.  Dispense: 180 tablet; Refill: 1 - amLODipine (NORVASC) 10 MG tablet; Take 1 tablet (10 mg total) by mouth daily.  Dispense: 90 tablet; Refill: 1  4. Hyperlipidemia with target LDL less than  100 Low fat diet - Lipid panel  5. Bruit of left carotid artery - US Carotid Duplex Bilateral; Future    Labs pending Health maintenance reviewed Diet and exercise encouraged Continue all meds Follow up  In 6 months   [place], FNP

## 2015-05-27 LAB — LIPID PANEL
CHOL/HDL RATIO: 4.8 ratio — AB (ref 0.0–4.4)
Cholesterol, Total: 222 mg/dL — ABNORMAL HIGH (ref 100–199)
HDL: 46 mg/dL (ref >39)
LDL Calculated: 133 mg/dL — ABNORMAL HIGH (ref 0–99)
Triglycerides: 214 mg/dL — ABNORMAL HIGH (ref 0–149)
VLDL CHOLESTEROL CAL: 43 mg/dL — AB (ref 5–40)

## 2015-05-27 LAB — CMP14+EGFR
A/G RATIO: 1.8 (ref 1.1–2.5)
ALBUMIN: 4.6 g/dL (ref 3.6–4.8)
ALT: 13 IU/L (ref 0–32)
AST: 14 IU/L (ref 0–40)
Alkaline Phosphatase: 85 IU/L (ref 39–117)
BUN / CREAT RATIO: 16 (ref 11–26)
BUN: 12 mg/dL (ref 8–27)
Bilirubin Total: 0.3 mg/dL (ref 0.0–1.2)
CALCIUM: 10.2 mg/dL (ref 8.7–10.3)
CO2: 24 mmol/L (ref 18–29)
Chloride: 102 mmol/L (ref 97–108)
Creatinine, Ser: 0.76 mg/dL (ref 0.57–1.00)
GFR calc Af Amer: 96 mL/min/{1.73_m2} (ref >59)
GFR, EST NON AFRICAN AMERICAN: 83 mL/min/{1.73_m2} (ref >59)
GLOBULIN, TOTAL: 2.5 g/dL (ref 1.5–4.5)
Glucose: 92 mg/dL (ref 65–99)
Potassium: 4.8 mmol/L (ref 3.5–5.2)
Sodium: 141 mmol/L (ref 134–144)
TOTAL PROTEIN: 7.1 g/dL (ref 6.0–8.5)

## 2015-05-30 ENCOUNTER — Telehealth: Payer: Self-pay | Admitting: *Deleted

## 2015-05-30 ENCOUNTER — Other Ambulatory Visit: Payer: Self-pay | Admitting: Nurse Practitioner

## 2015-05-30 MED ORDER — ATORVASTATIN CALCIUM 40 MG PO TABS: 40.0000 mg | ORAL_TABLET | Freq: Every day | ORAL | Status: DC

## 2015-05-30 NOTE — Telephone Encounter (Signed)
-----   Message from [facility], [place] sent at 05/29/2015  8:06 PM EDT ----- Kidney and liver function stable LDL elevated aas welll as trig- needs to be on statin- will she take Strict low fat low carbv diet and recheck on 3 months

## 2015-05-30 NOTE — Telephone Encounter (Signed)
Patient aware of lab results.  Agreeable to taking statin drug.

## 2015-05-30 NOTE — Telephone Encounter (Signed)
lipitor rx sent to pharmacy 

## 2015-05-31 ENCOUNTER — Telehealth: Payer: Self-pay | Admitting: Nurse Practitioner

## 2015-05-31 NOTE — Telephone Encounter (Signed)
Spoke with pt Will call and request results

## 2015-06-02 NOTE — Telephone Encounter (Signed)
Yvette [name] did you ever receive results?

## 2015-07-04 ENCOUNTER — Telehealth: Payer: Self-pay | Admitting: Nurse Practitioner

## 2015-07-04 NOTE — Telephone Encounter (Signed)
Just continue meds as rx- readings are good

## 2015-07-04 NOTE — Telephone Encounter (Signed)
FYI

## 2015-07-04 NOTE — Telephone Encounter (Signed)
DENIED 

## 2015-10-02 ENCOUNTER — Telehealth: Payer: Self-pay | Admitting: Nurse Practitioner

## 2015-10-02 NOTE — Telephone Encounter (Signed)
Please address

## 2015-10-19 ENCOUNTER — Other Ambulatory Visit: Payer: Self-pay | Admitting: Nurse Practitioner

## 2015-10-19 MED ORDER — DICLOFENAC SODIUM 1 % TD GEL: 4.0000 g | Freq: Four times a day (QID) | TRANSDERMAL | Status: AC

## 2015-10-19 NOTE — Telephone Encounter (Signed)
done

## 2015-10-24 ENCOUNTER — Telehealth: Payer: Self-pay | Admitting: Nurse Practitioner

## 2015-10-25 NOTE — Telephone Encounter (Signed)
Patient advised that insurance would probably need documentation from a visit with primary care provider to approve a referral for the knot on her wrist. Please call to schedule an appointment.

## 2015-11-02 ENCOUNTER — Telehealth: Payer: Self-pay

## 2015-11-02 NOTE — Telephone Encounter (Signed)
Insurance prior authorized Voltaren Gel through 08/18/16

## 2015-11-24 ENCOUNTER — Encounter: Payer: Self-pay | Admitting: *Deleted

## 2015-11-24 ENCOUNTER — Ambulatory Visit (INDEPENDENT_AMBULATORY_CARE_PROVIDER_SITE_OTHER): Payer: Medicare Other | Admitting: Nurse Practitioner

## 2015-11-24 ENCOUNTER — Ambulatory Visit (INDEPENDENT_AMBULATORY_CARE_PROVIDER_SITE_OTHER)

## 2015-11-24 ENCOUNTER — Ambulatory Visit: Payer: Medicare Other

## 2015-11-24 ENCOUNTER — Encounter: Payer: Self-pay | Admitting: Nurse Practitioner

## 2015-11-24 VITALS — BP 132/86 | HR 66 | Temp 97.0°F | Ht 64.0 in | Wt 221.0 lb

## 2015-11-24 DIAGNOSIS — K219 Gastro-esophageal reflux disease without esophagitis: Secondary | ICD-10-CM | POA: Diagnosis not present

## 2015-11-24 DIAGNOSIS — Z1159 Encounter for screening for other viral diseases: Secondary | ICD-10-CM

## 2015-11-24 DIAGNOSIS — Z23 Encounter for immunization: Secondary | ICD-10-CM

## 2015-11-24 DIAGNOSIS — I10 Essential (primary) hypertension: Secondary | ICD-10-CM

## 2015-11-24 DIAGNOSIS — E785 Hyperlipidemia, unspecified: Secondary | ICD-10-CM | POA: Diagnosis not present

## 2015-11-24 DIAGNOSIS — M25531 Pain in right wrist: Secondary | ICD-10-CM | POA: Diagnosis not present

## 2015-11-24 DIAGNOSIS — M25532 Pain in left wrist: Secondary | ICD-10-CM | POA: Diagnosis not present

## 2015-11-24 DIAGNOSIS — Z13820 Encounter for screening for osteoporosis: Secondary | ICD-10-CM

## 2015-11-24 DIAGNOSIS — Z1212 Encounter for screening for malignant neoplasm of rectum: Secondary | ICD-10-CM

## 2015-11-24 LAB — HM MAMMOGRAPHY

## 2015-11-24 MED ORDER — AMLODIPINE BESYLATE 10 MG PO TABS: 10.0000 mg | ORAL_TABLET | Freq: Every day | ORAL | Status: DC

## 2015-11-24 MED ORDER — OMEPRAZOLE 20 MG PO CPDR: 20.0000 mg | DELAYED_RELEASE_CAPSULE | Freq: Every day | ORAL | Status: DC

## 2015-11-24 MED ORDER — BENAZEPRIL HCL 20 MG PO TABS: 20.0000 mg | ORAL_TABLET | Freq: Two times a day (BID) | ORAL | Status: DC

## 2015-11-24 NOTE — Progress Notes (Signed)
Subjective:    Patient ID: Yvette [name], female    DOB: Jul 15, 1951, 65 y.o.   MRN: [id number]  Patient here today for follow up of chronic medical problems.  Outpatient Encounter Prescriptions as of 11/24/2015  Medication Sig  . amLODipine (NORVASC) 10 MG tablet Take 1 tablet (10 mg total) by mouth daily.  . benazepril (LOTENSIN) 20 MG tablet Take 1 tablet (20 mg total) by mouth 2 (two) times daily.  . diclofenac sodium (VOLTAREN) 1 % GEL Apply 4 g topically 4 (four) times daily.  . meloxicam (MOBIC) 15 MG tablet Take 1 tablet (15 mg total) by mouth daily.  Marland Kitchen omeprazole (PRILOSEC) 20 MG capsule Take 1 capsule (20 mg total) by mouth daily.  . [DISCONTINUED] atorvastatin (LIPITOR) 40 MG tablet Take 1 tablet (40 mg total) by mouth daily.   No facility-administered encounter medications on file as of 11/24/2015.    Hypertension This is a chronic problem. The current episode started more than 1 year ago. The problem is resistant. Pertinent negatives include no chest pain, headaches, palpitations or shortness of breath. Risk factors for coronary artery disease include post-menopausal state. Past treatments include ACE inhibitors and angiotensin blockers. There are no compliance problems.   Hyperlipidemia This is a chronic problem. This is a new diagnosis. The problem is uncontrolled. Recent lipid tests were reviewed and are variable. Pertinent negatives include no chest pain or shortness of breath. She is currently on no antihyperlipidemic treatment. Compliance problems include adherence to diet and adherence to exercise.  Risk factors for coronary artery disease include dyslipidemia, hypertension, obesity and post-menopausal.  GERD Omeprazole working well- keeps symptoms under control  * Knot on right radial head that just appeared - she says that both wrist are very achy.- she has had a total knee replacement bil.   Review of Systems  Constitutional: Negative.   HENT: Negative.   Eyes:  Negative.   Respiratory: Negative.  Negative for shortness of breath.   Cardiovascular: Negative.  Negative for chest pain and palpitations.  Endocrine: Negative.   Genitourinary: Negative.   Musculoskeletal: Negative.   Allergic/Immunologic: Negative.   Neurological: Negative.  Negative for headaches.  Hematological: Negative.   Psychiatric/Behavioral: Negative.   All other systems reviewed and are negative.      Objective:   Physical Exam  Constitutional: She is oriented to person, place, and time. She appears well-developed and well-nourished.  HENT:  Nose: Nose normal.  Mouth/Throat: Oropharynx is clear and moist.  Eyes: EOM are normal.  Neck: Trachea normal, normal range of motion and full passive range of motion without pain. Neck supple. No JVD present. Carotid bruit is not present. No thyromegaly present.  Cardiovascular: Normal rate, regular rhythm, normal heart sounds and intact distal pulses.  Exam reveals no gallop and no friction rub.   No murmur heard. Left carotid bruit   Pulmonary/Chest: Effort normal and breath sounds normal.  Abdominal: Soft. Bowel sounds are normal. She exhibits no distension and no mass. There is no tenderness.  Musculoskeletal: Normal range of motion.  Tender nodule right radial head  Lymphadenopathy:    She has no cervical adenopathy.  Neurological: She is alert and oriented to person, place, and time. She has normal reflexes.  Skin: Skin is warm and dry.  Psychiatric: She has a normal mood and affect. Her behavior is normal. Judgment and thought content normal.   BP 132/86 mmHg  Pulse 66  Temp(Src) 97 F (36.1 C) (Oral)  Ht _0  (1.626 m)  Wt 221 lb (100.245 kg)  BMI 37.92 kg/m2  Chst x ray- no cardiopulmonary disease-Preliminary reading by Ronnald Collum, FNP  [facility]  EKG- NSR-Mary-Margaret Hassell Done, FNP      Assessment & Plan:   1. Essential hypertension Do not add salt to diet - amLODipine (NORVASC) 10 MG tablet; Take 1 tablet  (10 mg total) by mouth daily.  Dispense: 90 tablet; Refill: 1 - benazepril (LOTENSIN) 20 MG tablet; Take 1 tablet (20 mg total) by mouth 2 (two) times daily.  Dispense: 180 tablet; Refill: 1 - CMP14+EGFR - DG Chest 2 View; Future - EKG 12-Lead  2. Gastroesophageal reflux disease without esophagitis Avoid spicy foods Do not eat 2 hours prior to bedtime - omeprazole (PRILOSEC) 20 MG capsule; Take 1 capsule (20 mg total) by mouth daily.  Dispense: 90 capsule; Refill: 1  3. Morbid obesity, unspecified obesity type ([place]) Discussed diet and exercise for person with BMI >25 Will recheck weight in 3-6 months  4. Hyperlipidemia with target LDL less than 100 Low fta diet - Lipid panel  5. Bilateral wrist pain - Arthritis Panel - Ambulatory referral to Rheumatology  6. Need for hepatitis C screening test - Hepatitis C antibody  7. Screening for malignant neoplasm of the rectum - Fecal occult blood, imunochemical; Future  8. Screening for osteoporosis weight bearing exercises dexa scan today - DG Bone Density; Future    Labs pending Health maintenance reviewed Diet and exercise encouraged Continue all meds Follow up  In 6 months   [place], FNP

## 2015-11-25 LAB — CMP14+EGFR
A/G RATIO: 1.8 (ref 1.2–2.2)
ALT: 12 IU/L (ref 0–32)
AST: 14 IU/L (ref 0–40)
Albumin: 4.5 g/dL (ref 3.6–4.8)
Alkaline Phosphatase: 89 IU/L (ref 39–117)
BUN/Creatinine Ratio: 15 (ref 12–28)
BUN: 11 mg/dL (ref 8–27)
Bilirubin Total: 0.3 mg/dL (ref 0.0–1.2)
CALCIUM: 10 mg/dL (ref 8.7–10.3)
CO2: 21 mmol/L (ref 18–29)
CREATININE: 0.75 mg/dL (ref 0.57–1.00)
Chloride: 103 mmol/L (ref 96–106)
GFR, EST AFRICAN AMERICAN: 97 mL/min/{1.73_m2} (ref >59)
GFR, EST NON AFRICAN AMERICAN: 84 mL/min/{1.73_m2} (ref >59)
Globulin, Total: 2.5 g/dL (ref 1.5–4.5)
Glucose: 95 mg/dL (ref 65–99)
POTASSIUM: 4.3 mmol/L (ref 3.5–5.2)
Sodium: 142 mmol/L (ref 134–144)
TOTAL PROTEIN: 7 g/dL (ref 6.0–8.5)

## 2015-11-25 LAB — LIPID PANEL
CHOL/HDL RATIO: 5.2 ratio — AB (ref 0.0–4.4)
Cholesterol, Total: 213 mg/dL — ABNORMAL HIGH (ref 100–199)
HDL: 41 mg/dL (ref >39)
LDL CALC: 132 mg/dL — AB (ref 0–99)
Triglycerides: 199 mg/dL — ABNORMAL HIGH (ref 0–149)
VLDL CHOLESTEROL CAL: 40 mg/dL (ref 5–40)

## 2015-11-25 LAB — ARTHRITIS PANEL
BASOS: 1 %
Basophils Absolute: 0 10*3/uL (ref 0.0–0.2)
EOS (ABSOLUTE): 0.2 10*3/uL (ref 0.0–0.4)
Eos: 4 %
HEMOGLOBIN: 12.4 g/dL (ref 11.1–15.9)
Hematocrit: 38.2 % (ref 34.0–46.6)
Immature Grans (Abs): 0 10*3/uL (ref 0.0–0.1)
Immature Granulocytes: 0 %
LYMPHS ABS: 1.7 10*3/uL (ref 0.7–3.1)
Lymphs: 31 %
MCH: 29.4 pg (ref 26.6–33.0)
MCHC: 32.5 g/dL (ref 31.5–35.7)
MCV: 91 fL (ref 79–97)
MONOS ABS: 0.4 10*3/uL (ref 0.1–0.9)
Monocytes: 8 %
NEUTROS ABS: 3.2 10*3/uL (ref 1.4–7.0)
Neutrophils: 56 %
Platelets: 339 10*3/uL (ref 150–379)
RBC: 4.22 x10E6/uL (ref 3.77–5.28)
RDW: 14.1 % (ref 12.3–15.4)
Sed Rate: 9 mm/hr (ref 0–40)
URIC ACID: 6.4 mg/dL (ref 2.5–7.1)
WBC: 5.7 10*3/uL (ref 3.4–10.8)

## 2015-11-25 LAB — HEPATITIS C ANTIBODY

## 2015-11-30 ENCOUNTER — Encounter: Payer: Self-pay | Admitting: *Deleted

## 2015-12-08 ENCOUNTER — Other Ambulatory Visit: Payer: Self-pay | Admitting: *Deleted

## 2015-12-08 MED ORDER — MELOXICAM 15 MG PO TABS: 15.0000 mg | ORAL_TABLET | Freq: Every day | ORAL | Status: DC

## 2016-01-23 ENCOUNTER — Encounter: Payer: Self-pay | Admitting: *Deleted

## 2016-06-04 ENCOUNTER — Ambulatory Visit: Admitting: Nurse Practitioner

## 2016-07-22 ENCOUNTER — Ambulatory Visit: Admitting: Nurse Practitioner

## 2016-08-29 ENCOUNTER — Other Ambulatory Visit: Payer: Self-pay | Admitting: *Deleted

## 2016-08-29 DIAGNOSIS — K219 Gastro-esophageal reflux disease without esophagitis: Secondary | ICD-10-CM

## 2016-08-29 DIAGNOSIS — I10 Essential (primary) hypertension: Secondary | ICD-10-CM

## 2016-08-29 MED ORDER — AMLODIPINE BESYLATE 10 MG PO TABS: 10.0000 mg | ORAL_TABLET | Freq: Every day | ORAL | 0 refills | Status: DC

## 2016-08-29 MED ORDER — OMEPRAZOLE 20 MG PO CPDR: 20.0000 mg | DELAYED_RELEASE_CAPSULE | Freq: Every day | ORAL | 1 refills | Status: DC

## 2016-08-29 NOTE — Addendum Note (Signed)
Addended by: Jamelle Haring on: 08/29/2016 11:05 AM   Modules accepted: Orders

## 2016-10-04 ENCOUNTER — Other Ambulatory Visit: Payer: Self-pay

## 2016-10-04 DIAGNOSIS — I10 Essential (primary) hypertension: Secondary | ICD-10-CM

## 2016-10-04 MED ORDER — AMLODIPINE BESYLATE 10 MG PO TABS: 10.0000 mg | ORAL_TABLET | Freq: Every day | ORAL | 0 refills | Status: DC

## 2016-11-14 ENCOUNTER — Other Ambulatory Visit: Payer: Self-pay | Admitting: *Deleted

## 2016-11-14 DIAGNOSIS — K219 Gastro-esophageal reflux disease without esophagitis: Secondary | ICD-10-CM

## 2016-11-14 MED ORDER — OMEPRAZOLE 20 MG PO CPDR: 20.0000 mg | DELAYED_RELEASE_CAPSULE | Freq: Every day | ORAL | 0 refills | Status: DC

## 2019-07-19 LAB — LIPID PANEL
Cholesterol: 229 — AB (ref 0–200)
HDL: 55 (ref 35–70)
LDL Cholesterol: 143
Triglycerides: 154 (ref 40–160)

## 2019-07-19 LAB — BASIC METABOLIC PANEL
BUN: 14 (ref 4–21)
CO2: 25 — AB (ref 13–22)
Chloride: 105 (ref 99–108)
Creatinine: 0.7 (ref 0.5–1.1)
Glucose: 85
Potassium: 4 (ref 3.4–5.3)
Sodium: 140 (ref 137–147)

## 2019-07-19 LAB — COMPREHENSIVE METABOLIC PANEL
Albumin: 4.7 (ref 3.5–5.0)
Calcium: 10.4 (ref 8.7–10.7)
GFR calc non Af Amer: 59

## 2019-07-19 LAB — HEPATIC FUNCTION PANEL
ALT: 16 (ref 7–35)
AST: 18 (ref 13–35)
Alkaline Phosphatase: 76 (ref 25–125)
Bilirubin, Total: 0.4

## 2019-07-19 LAB — CBC AND DIFFERENTIAL
HCT: 40 (ref 36–46)
Hemoglobin: 13.3 (ref 12.0–16.0)
Platelets: 290 (ref 150–399)
WBC: 6.9

## 2019-07-19 LAB — TSH: TSH: 2.69 (ref 0.41–5.90)

## 2019-07-23 LAB — HM MAMMOGRAPHY

## 2019-11-15 ENCOUNTER — Ambulatory Visit: Payer: Self-pay

## 2019-11-16 ENCOUNTER — Ambulatory Visit: Payer: Self-pay | Attending: Internal Medicine

## 2019-11-16 DIAGNOSIS — Z23 Encounter for immunization: Secondary | ICD-10-CM

## 2019-11-16 NOTE — Progress Notes (Signed)
   Covid-19 Vaccination Clinic  Name:  Yvette [name]    MRN: [id number] DOB: 05-Jul-1951  11/16/2019  Yvette [name] was observed post Covid-19 immunization for 15 minutes without incident. She was provided with Vaccine Information Sheet and instruction to access the V-Safe system.   Yvette [name] was instructed to call 911 with any severe reactions post vaccine: Marland Kitchen Difficulty breathing  . Swelling of face and throat  . A fast heartbeat  . A bad rash all over body  . Dizziness and weakness   Immunizations Administered    Name Date Dose VIS Date Route   Pfizer COVID-19 Vaccine 11/16/2019  2:37 PM 0.3 mL 07/30/2019 Intramuscular   Manufacturer: [place]   Lot: [id number]   [place]: [id number]

## 2019-11-28 NOTE — Patient Instructions (Addendum)
It was very nice to meet you today- please see me in 3-4 months for follow-up, we can do your routine labs at that time I will request your records from Ms. Whitwell in [place]  Please continue to monitor your BP at home- if you are running higher than 140/90 on a regular basis please alert me

## 2019-11-28 NOTE — Progress Notes (Signed)
[place] at [facility] [address] [place], [place] [address] [phone]  Date:  11/29/2019   Name:  Yvette [name]   DOB:  1951-01-19   MRN:  [id number]  PCP:  Darreld Mclean, MD    Chief Complaint: New Patient (Initial Visit) (no concerns)   History of Present Illness:  Yvette [name] is a 69 y.o. very pleasant female patient who presents with the following:  New patient here today to establish care - recently moved from the [place] area to [place].  She is the ex-wife of my late patient Marlise Shankman- they divorced in 11/25/2001 but remained amicable.  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  She retired in 11-26-2014 She lives with her daughter and helps with her 59 and 108 yo grands who are home-schooling currently due to pandemic She has 3 children- her oldest died in 11/26/15.  This was her son Yvette [name]- he was 55 yo.  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 She needs routine screening  Routine labs- in October, pt notes all ok except her lipids have been high  She cannot tolerate statins -does not wish to take a statin medication for cholesterol She has had both covid vaccines, dates recorded  Never a smoker   She does have a home BP cuff and her home readings are generally ok- she has "white coat syndrome" When she checks her BP at home it tends to run less than 130/80  Right arm: She has noted some tenderness in her  right biceps, she thinks this is due to the recent move and carrying heavy boxes.  She does not have any weakness or deformity of the arm  Her last PCP was Margaree Mackintosh FNP  Address: [address] [place], [place] [address] -Send records request form Patient Active Problem List   Diagnosis Date Noted  . Hyperlipidemia with target LDL less than 100 05/26/2015  . Morbid obesity ([place]) 03/16/2015  . Hypertension 02/17/2013  . GERD (gastroesophageal reflux disease) 02/17/2013    Past Medical History:  Diagnosis Date  . GERD (gastroesophageal reflux disease)   . Hypertension     Past Surgical History:  Procedure Laterality Date  . ABDOMINAL HYSTERECTOMY     total  . knee surgery Bilateral   . TONSILLECTOMY      Social History   Tobacco Use  . Smoking status: Never Smoker  . Smokeless tobacco: Never Used  Substance Use Topics  . Alcohol use: Yes  . Drug use: No    Family History  Problem Relation Age of Onset  . Alcohol abuse Mother   . Cirrhosis Mother   . Heart disease Father   . Parkinson's disease Brother     No Known Allergies  Medication list has been reviewed and updated.  Current Outpatient Medications on File Prior to Visit  Medication Sig Dispense Refill  . amLODipine (NORVASC) 10 MG tablet  Take 1 tablet (10 mg total) by mouth daily. 30 tablet 0  . benazepril (LOTENSIN) 20 MG tablet Take 1 tablet (20 mg total) by mouth 2 (two) times daily. 180 tablet 1  . diclofenac sodium (VOLTAREN) 1 % GEL Apply 4 g topically 4 (four) times daily. 500 g 1  . fluticasone (FLONASE) 50 MCG/ACT nasal spray Place 1 spray into both nostrils daily.    Marland Kitchen omeprazole (PRILOSEC) 20 MG capsule Take 1 capsule (20 mg total) by mouth daily. 30 capsule 0   No current facility-administered medications on file prior to visit.    Review of Systems:  As per HPI- otherwise negative.   Physical Examination: Vitals:   11/29/19 1057 11/29/19 1121  BP: (!) 162/78 (!) 160/80  Pulse: 65    Resp: 16   Temp: 98 F (36.7 C)   SpO2: 97%    Vitals:   11/29/19 1057  Weight: 224 lb (101.6 kg)  Height: 5' 3.25" (1.607 m)   Body mass index is 39.37 kg/m. Ideal Body Weight: Weight in (lb) to have BMI = 25: 142  GEN: no acute distress.  Obese, otherwise looks well  Bilateral TM wnl, oropharynx normal.  PEERL,EOMI.   HEENT: Atraumatic, Normocephalic.  Ears and Nose: No external deformity. CV: RRR, No M/G/R. No JVD. No thrill. No extra heart sounds. PULM: CTA B, no wheezes, crackles, rhonchi. No retractions. No resp. distress. No accessory muscle use. ABD: S, NT, ND, +BS. No rebound. No HSM. EXTR: No c/c/e PSYCH: Normally interactive. Conversant.  Right arm: Elbow, wrist and shoulder normal.  Patient has mild tenderness palpation over the mid biceps muscle.  The biceps tendons are intact.  Normal strength of biceps, deltoid, triceps   Assessment and Plan: Essential hypertension  Estrogen deficiency - Plan: DG Bone Density  Encounter for medical examination to establish care  Hyperlipidemia, unspecified hyperlipidemia type  Grief at loss of child  Strain of right biceps, initial encounter  Here today to establish care.  I have requested her records from her previous physician in [place] Ordered bone density, her screening tests are otherwise up-to-date Patient had lab work done in the last several months, she declines labs today Offered my condolences and support regarding the loss of her eldest son Yvette [name]; she does have a support group about the breech mothers that is helpful for her Counseled her that she tends to have a biceps muscle strain.  I think this will recover with rest, she will let me know if any concerns  This visit occurred during the SARS-CoV-2 public health emergency.  Safety protocols were in place, including screening questions prior to the visit, additional usage of staff PPE, and extensive cleaning of exam room while observing appropriate contact  time as indicated for disinfecting solutions.    Signed Lamar Blinks, MD

## 2019-11-29 ENCOUNTER — Ambulatory Visit (INDEPENDENT_AMBULATORY_CARE_PROVIDER_SITE_OTHER): Payer: Medicare PPO | Admitting: Family Medicine

## 2019-11-29 ENCOUNTER — Encounter: Payer: Self-pay | Admitting: Family Medicine

## 2019-11-29 ENCOUNTER — Other Ambulatory Visit: Payer: Self-pay

## 2019-11-29 VITALS — BP 160/80 | HR 65 | Temp 98.0°F | Resp 16 | Ht 63.25 in | Wt 224.0 lb

## 2019-11-29 DIAGNOSIS — E785 Hyperlipidemia, unspecified: Secondary | ICD-10-CM

## 2019-11-29 DIAGNOSIS — Z0000 Encounter for general adult medical examination without abnormal findings: Secondary | ICD-10-CM

## 2019-11-29 DIAGNOSIS — I10 Essential (primary) hypertension: Secondary | ICD-10-CM

## 2019-11-29 DIAGNOSIS — F4321 Adjustment disorder with depressed mood: Secondary | ICD-10-CM | POA: Diagnosis not present

## 2019-11-29 DIAGNOSIS — E2839 Other primary ovarian failure: Secondary | ICD-10-CM | POA: Diagnosis not present

## 2019-11-29 DIAGNOSIS — S46211A Strain of muscle, fascia and tendon of other parts of biceps, right arm, initial encounter: Secondary | ICD-10-CM

## 2019-11-29 DIAGNOSIS — Z634 Disappearance and death of family member: Secondary | ICD-10-CM

## 2019-11-29 MED ORDER — METOPROLOL SUCCINATE ER 25 MG PO TB24: 25.0000 mg | ORAL_TABLET | Freq: Every day | ORAL | 3 refills | Status: DC

## 2019-12-06 ENCOUNTER — Encounter: Payer: Self-pay | Admitting: Family Medicine

## 2019-12-14 ENCOUNTER — Encounter: Payer: Self-pay | Admitting: Family Medicine

## 2020-02-22 NOTE — Progress Notes (Deleted)
[facility] at [facility] [address], [place], [place] [address] [phone] [name]  Date:  02/28/2020   Name:  Yvette [name]   DOB:  1950/09/10   MRN:  [id number]  PCP:  Yvette Mclean, MD    Chief Complaint: No chief complaint on file.   History of Present Illness:  Yvette [name] is a 69 y.o. very pleasant female patient who presents with the following:  Periodic follow-up visit today History of HTN, hyperlipidemia, obesity Last seen by myself in April of this year to establish care  She is the ex-wife of my late patient Yvette [name]- they divorced in 11-26-01 but remained amicable.  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  She retired in 11/27/14 She lives with her daughter and helps with her 83 and 54 yo grands who are home-schooling currently due to pandemic She has 3 children- her oldest died in 11/27/15.  This was her son Yvette [name]- he was 3 yo.  She has a son Yvette [name] and her daughter Yvette [name]   At her last visit her blood pressure was noted to be high, I asked her to please monitor this at home With plan to have her come in in 3 to 4 months for routine labs  Colonoscopy 11-26-16 DEXA scan Mammogram up-to-date COVID-19 up-to-date Shingrix Can do routine blood work today  Patient Active Problem List   Diagnosis Date Noted  . Hyperlipidemia with target LDL less than 100 05/26/2015  . Morbid obesity ([place]) 03/16/2015  . Hypertension 02/17/2013  . GERD (gastroesophageal reflux disease) 02/17/2013    Past Medical History:  Diagnosis Date  . GERD (gastroesophageal reflux disease)   . Hypertension     Past Surgical History:  Procedure Laterality Date  . ABDOMINAL HYSTERECTOMY     total   . knee surgery Bilateral   . TONSILLECTOMY      Social History   Tobacco Use  . Smoking status: Never Smoker  . Smokeless tobacco: Never Used  Substance Use Topics  . Alcohol use: Yes  . Drug use: No    Family History  Problem Relation Age of Onset  . Alcohol abuse Mother   . Cirrhosis Mother   . Heart disease Father   . Parkinson's disease Brother     No Known Allergies  Medication list has been reviewed and updated.  Current Outpatient Medications on File Prior to Visit  Medication Sig Dispense Refill  . amLODipine (NORVASC) 10 MG tablet Take 1 tablet (10 mg total) by mouth daily. 30 tablet 0  . benazepril (LOTENSIN) 20 MG tablet Take 1 tablet (20 mg total) by mouth 2 (two) times daily. 180 tablet 1  . diclofenac sodium (VOLTAREN) 1 % GEL Apply 4 g topically 4 (four) times daily. 500 g 1  . fluticasone (FLONASE) 50 MCG/ACT nasal spray Place 1 spray into both nostrils daily.    . metoprolol succinate (TOPROL-XL) 25 MG 24 hr tablet Take 1 tablet (25 mg total) by mouth daily. 90 tablet 3  . omeprazole (PRILOSEC) 20 MG capsule Take 1 capsule (20 mg total) by mouth daily. 30 capsule 0   No current facility-administered medications on file prior to visit.  Review of Systems:  As per HPI- otherwise negative.   Physical Examination: There were no vitals filed for this visit. There were no vitals filed for this visit. There is no height or weight on file to calculate BMI. Ideal Body Weight:    GEN: no acute distress. HEENT: Atraumatic, Normocephalic.  Ears and Nose: No external deformity. CV: RRR, No M/G/R. No JVD. No thrill. No extra heart sounds. PULM: CTA B, no wheezes, crackles, rhonchi. No retractions. No resp. distress. No accessory muscle use. ABD: S, NT, ND, +BS. No rebound. No HSM. EXTR: No c/c/e PSYCH: Normally interactive. Conversant.    Assessment and Plan: *** This visit occurred during the SARS-CoV-2 public health emergency.  Safety protocols  were in place, including screening questions prior to the visit, additional usage of staff PPE, and extensive cleaning of exam room while observing appropriate contact time as indicated for disinfecting solutions.    Signed Yvette Blinks, MD

## 2020-02-24 NOTE — Patient Instructions (Addendum)
It was great to see you again today-  I will be in touch with your labs  Please consider getting the shingles vaccine at your drugstore at your convenience I am so sorry that things are hard right now.  Let me know if I can help  If you are looking for a primary closer to home I would recommend Dr. Margaretmary Eddy [facility] [address] [place], [place] [address] [phone]

## 2020-02-24 NOTE — Progress Notes (Signed)
[place] at [facility] [place], [name], [name] [address] [phone]  Date:  02/28/2020   Name:  Yvette [name]   DOB:  05-13-1951   MRN:  [id number]  PCP:  Darreld Mclean, MD    Chief Complaint: Hypertension (3 month follow up)   History of Present Illness:  Yvette [name] is a 69 y.o. very pleasant female patient who presents with the following:  Patient here today for periodic follow-up visit History of hypertension, hyperlipidemia, GERD, obesity Last seen by myself in April of this year-we established care at that time  New patient here today to establish care - recently moved from the [place] area to [place].  She is the ex-wife of my late patient Yvette [name]- they divorced in 11/25/2001 but remained amicable.  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  She retired in 11/26/14 She lives with her daughter and helps with her 23 and 72 yo grands who are home-schooling currently due to pandemic She has 3 children- her oldest died in Nov 26, 2015.  This was her son Yvette [name]- he was 55 yo.  She has a son Yvette [name] and her daughter Yvette [name]   Amlodipine Benazepril Toprol-XL 25 Omeprazole Voltaren gel  Colonoscopy 2016-11-25 DEXA scan; will order for her today  Mammo up-to-date Most recent blood work in November, can offer update today COVID series is complete  Today Yvette [name] is really upset- she has been living with her daughter but they had a falling out.   She plans to move in with her sister who lives in [place].  She is afraid her daughter will deny her access to her grandkids For the time being she declines any medication or counseling Patient Active Problem List   Diagnosis Date Noted   . Hyperlipidemia with target LDL less than 100 05/26/2015  . Morbid obesity ([place]) 03/16/2015  . Hypertension 02/17/2013  . GERD (gastroesophageal reflux disease) 02/17/2013    Past Medical History:  Diagnosis Date  . GERD (gastroesophageal reflux disease)   . Hypertension     Past Surgical History:  Procedure Laterality Date  . ABDOMINAL HYSTERECTOMY     total  . knee surgery Bilateral   . TONSILLECTOMY      Social History   Tobacco Use  . Smoking status: Never Smoker  . Smokeless tobacco: Never Used  Substance Use Topics  . Alcohol use: Yes  . Drug use: No    Family History  Problem Relation Age of Onset  . Alcohol abuse Mother   . Cirrhosis Mother   . Heart disease Father   . Parkinson's disease Brother     No Known Allergies  Medication list has been reviewed and updated.  Current Outpatient Medications on File Prior to Visit  Medication Sig Dispense Refill  . amLODipine (NORVASC) 10 MG tablet Take 1 tablet (10 mg total) by mouth daily. 30 tablet 0  . benazepril (LOTENSIN) 20 MG tablet Take 1 tablet (20 mg total) by mouth 2 (two) times daily. 180 tablet 1  . diclofenac sodium (VOLTAREN) 1 % GEL Apply 4 g topically 4 (four) times daily. 500 g 1  . metoprolol succinate (TOPROL-XL) 25 MG 24 hr tablet Take 1  tablet (25 mg total) by mouth daily. 90 tablet 3  . omeprazole (PRILOSEC) 20 MG capsule Take 1 capsule (20 mg total) by mouth daily. 30 capsule 0  . fluticasone (FLONASE) 50 MCG/ACT nasal spray Place 1 spray into both nostrils daily. (Patient not taking: Reported on 02/28/2020)     No current facility-administered medications on file prior to visit.    Review of Systems:  As per HPI- otherwise negative.   Physical Examination: Vitals:   02/28/20 1505  BP: (!) 150/88  Pulse: 84  Resp: 17  SpO2: 98%   Vitals:   02/28/20 1505  Weight: 218 lb (98.9 kg)  Height: 5\' 3"  (1.6 m)   Body mass index is 38.62 kg/m. Ideal Body Weight: Weight in (lb) to  have BMI = 25: 140.8  GEN: no acute distress.  Overweight, looks well  HEENT: Atraumatic, Normocephalic.  Ears and Nose: No external deformity. CV: RRR, No M/G/R. No JVD. No thrill. No extra heart sounds. PULM: CTA B, no wheezes, crackles, rhonchi. No retractions. No resp. distress. No accessory muscle use. ABD: S, NT, ND, +BS. No rebound. No HSM. EXTR: No c/c/e PSYCH: Normally interactive. Conversant.   BP is likely elevated today as pt is upset and crying  BP Readings from Last 3 Encounters:  02/28/20 (!) 150/88  11/29/19 (!) 160/80  11/24/15 132/86   Results for orders placed or performed in visit on 02/28/20  POCT urinalysis dipstick  Result Value Ref Range   Color, UA yellow yellow   Clarity, UA cloudy (A) clear   Glucose, UA negative negative mg/dL   Bilirubin, UA negative negative   Ketones, POC UA negative negative mg/dL   Spec Grav, UA 1.020 1.010 - 1.025   Blood, UA negative negative   pH, UA 5.5 5.0 - 8.0   Protein Ur, POC trace (A) negative mg/dL   Urobilinogen, UA 1.0 0.2 or 1.0 E.U./dL   Nitrite, UA Negative Negative   Leukocytes, UA Moderate (2+) (A) Negative    Assessment and Plan: Essential hypertension - Plan: CBC, Basic metabolic panel  Hyperlipidemia, unspecified hyperlipidemia type  Estrogen deficiency - Plan: DG Bone Density  Urinary frequency - Plan: POCT urinalysis dipstick, Urine Culture, Hemoglobin A1c  Stress due to family tension  Following up today Offered support regarding her current family stressors  BP is generally under good control, continue current medications for now  She has noted urinary frequency - UA is suggestive.  Will order culture for her Bone density ordered Will plan further follow- up pending labs.  This visit occurred during the SARS-CoV-2 public health emergency.  Safety protocols were in place, including screening questions prior to the visit, additional usage of staff PPE, and extensive cleaning of exam room  while observing appropriate contact time as indicated for disinfecting solutions.    Signed Lamar Blinks, MD

## 2020-02-28 ENCOUNTER — Ambulatory Visit (INDEPENDENT_AMBULATORY_CARE_PROVIDER_SITE_OTHER): Payer: Medicare PPO | Admitting: Family Medicine

## 2020-02-28 ENCOUNTER — Ambulatory Visit: Payer: Medicare PPO | Admitting: Family Medicine

## 2020-02-28 ENCOUNTER — Other Ambulatory Visit: Payer: Self-pay

## 2020-02-28 ENCOUNTER — Encounter: Payer: Self-pay | Admitting: Family Medicine

## 2020-02-28 VITALS — BP 150/88 | HR 84 | Resp 17 | Ht 63.0 in | Wt 218.0 lb

## 2020-02-28 DIAGNOSIS — E2839 Other primary ovarian failure: Secondary | ICD-10-CM | POA: Diagnosis not present

## 2020-02-28 DIAGNOSIS — Z638 Other specified problems related to primary support group: Secondary | ICD-10-CM

## 2020-02-28 DIAGNOSIS — I10 Essential (primary) hypertension: Secondary | ICD-10-CM

## 2020-02-28 DIAGNOSIS — E785 Hyperlipidemia, unspecified: Secondary | ICD-10-CM | POA: Diagnosis not present

## 2020-02-28 DIAGNOSIS — R350 Frequency of micturition: Secondary | ICD-10-CM

## 2020-02-28 LAB — POCT URINALYSIS DIP (MANUAL ENTRY)
Bilirubin, UA: NEGATIVE
Blood, UA: NEGATIVE
Glucose, UA: NEGATIVE mg/dL
Ketones, POC UA: NEGATIVE mg/dL
Nitrite, UA: NEGATIVE
Spec Grav, UA: 1.02 (ref 1.010–1.025)
Urobilinogen, UA: 1 E.U./dL
pH, UA: 5.5 (ref 5.0–8.0)

## 2020-02-29 ENCOUNTER — Encounter: Payer: Self-pay | Admitting: Family Medicine

## 2020-02-29 LAB — BASIC METABOLIC PANEL
BUN: 13 mg/dL (ref 6–23)
CO2: 26 mEq/L (ref 19–32)
Calcium: 10.4 mg/dL (ref 8.4–10.5)
Chloride: 103 mEq/L (ref 96–112)
Creatinine, Ser: 0.91 mg/dL (ref 0.40–1.20)
GFR: 61.21 mL/min (ref >60.00)
Glucose, Bld: 97 mg/dL (ref 70–99)
Potassium: 4.8 mEq/L (ref 3.5–5.1)
Sodium: 139 mEq/L (ref 135–145)

## 2020-02-29 LAB — URINE CULTURE
MICRO NUMBER:: 10693156
SPECIMEN QUALITY:: ADEQUATE

## 2020-02-29 LAB — CBC
HCT: 39.6 % (ref 36.0–46.0)
Hemoglobin: 13.2 g/dL (ref 12.0–15.0)
MCHC: 33.4 g/dL (ref 30.0–36.0)
MCV: 95.8 fl (ref 78.0–100.0)
Platelets: 311 10*3/uL (ref 150.0–400.0)
RBC: 4.13 Mil/uL (ref 3.87–5.11)
RDW: 13.2 % (ref 11.5–15.5)
WBC: 8.6 10*3/uL (ref 4.0–10.5)

## 2020-02-29 LAB — HEMOGLOBIN A1C: Hgb A1c MFr Bld: 5.5 % (ref 4.6–6.5)

## 2020-03-01 ENCOUNTER — Encounter: Payer: Self-pay | Admitting: Family Medicine

## 2020-10-09 ENCOUNTER — Other Ambulatory Visit: Payer: Self-pay | Admitting: Family Medicine

## 2020-10-09 ENCOUNTER — Telehealth: Payer: Self-pay | Admitting: *Deleted

## 2020-10-09 DIAGNOSIS — I10 Essential (primary) hypertension: Secondary | ICD-10-CM

## 2020-10-09 DIAGNOSIS — K219 Gastro-esophageal reflux disease without esophagitis: Secondary | ICD-10-CM

## 2020-10-09 MED ORDER — METOPROLOL SUCCINATE ER 25 MG PO TB24: 25.0000 mg | ORAL_TABLET | Freq: Every day | ORAL | 3 refills | Status: DC

## 2020-10-09 MED ORDER — OMEPRAZOLE 20 MG PO CPDR: 20.0000 mg | DELAYED_RELEASE_CAPSULE | Freq: Every day | ORAL | 0 refills | Status: DC

## 2020-10-09 MED ORDER — AMLODIPINE BESYLATE 10 MG PO TABS: 10.0000 mg | ORAL_TABLET | Freq: Every day | ORAL | 0 refills | Status: DC

## 2020-10-09 NOTE — Telephone Encounter (Signed)
Rx sent 

## 2020-10-09 NOTE — Telephone Encounter (Signed)
Caller Name [place] Phone Number [phone] Patient Name Yvette [name] Patient DOB 21-Feb-1951 Call Type Message Only Information Provided Reason for Call Medication Question / Request Initial Comment Caller states her blood pressure and acid reflux medications are not at her pharmacy. She is coming up on needing a refill. She will not be out before Monday. They are Lisinopril 20 mg 2x a day, amlodipine benzoate 10 mg, metoprolol succinate 25 mg, and omeprazole 20 mg. [facility] uses [facility] Phone number [phone] Additional Comment Caller was provided hours and asked to call back

## 2020-10-12 ENCOUNTER — Other Ambulatory Visit (HOSPITAL_COMMUNITY): Payer: Self-pay | Admitting: Family Medicine

## 2020-10-12 DIAGNOSIS — Z1231 Encounter for screening mammogram for malignant neoplasm of breast: Secondary | ICD-10-CM

## 2020-10-13 ENCOUNTER — Telehealth: Payer: Self-pay | Admitting: Family Medicine

## 2020-10-13 DIAGNOSIS — I10 Essential (primary) hypertension: Secondary | ICD-10-CM

## 2020-10-13 MED ORDER — METOPROLOL SUCCINATE ER 25 MG PO TB24: 25.0000 mg | ORAL_TABLET | Freq: Every day | ORAL | 3 refills | Status: DC

## 2020-10-13 MED ORDER — BENAZEPRIL HCL 20 MG PO TABS: 20.0000 mg | ORAL_TABLET | Freq: Two times a day (BID) | ORAL | 1 refills | Status: DC

## 2020-10-13 NOTE — Telephone Encounter (Signed)
NEW PHARMACY  Medication: metoprolol succinate (TOPROL-XL) 25 MG 24 hr tablet [[id number]]  benazepril (LOTENSIN) 20 MG tablet [[id number]]     Has the patient contacted their pharmacy?  (If no, request that the patient contact the pharmacy for the refill.) (If yes, when and what did the pharmacy advise?)     Preferred Pharmacy (with phone number or street name): [facility] [id number] - [place], [place] [address]  [address], [place] [address]  Phone:  [phone] Fax:  [phone]      Agent: Please be advised that RX refills may take up to 3 business days. We ask that you follow-up with your pharmacy.

## 2020-10-13 NOTE — Telephone Encounter (Signed)
Sent to new pharmacy

## 2020-10-26 ENCOUNTER — Ambulatory Visit (HOSPITAL_COMMUNITY)
Admission: RE | Admit: 2020-10-26 | Discharge: 2020-10-26 | Disposition: A | Discharge status: Home / Self Care | Payer: Medicare PPO | Source: Ambulatory Visit | Attending: Family Medicine | Admitting: Family Medicine

## 2020-10-26 ENCOUNTER — Encounter (HOSPITAL_COMMUNITY): Payer: Self-pay

## 2020-10-26 ENCOUNTER — Other Ambulatory Visit: Payer: Self-pay

## 2020-10-26 DIAGNOSIS — Z780 Asymptomatic menopausal state: Secondary | ICD-10-CM | POA: Diagnosis not present

## 2020-10-26 DIAGNOSIS — E2839 Other primary ovarian failure: Secondary | ICD-10-CM | POA: Diagnosis not present

## 2020-10-26 DIAGNOSIS — M810 Age-related osteoporosis without current pathological fracture: Secondary | ICD-10-CM | POA: Insufficient documentation

## 2020-10-26 DIAGNOSIS — Z1231 Encounter for screening mammogram for malignant neoplasm of breast: Secondary | ICD-10-CM | POA: Insufficient documentation

## 2020-10-26 DIAGNOSIS — Z13820 Encounter for screening for osteoporosis: Secondary | ICD-10-CM | POA: Diagnosis not present

## 2020-10-27 ENCOUNTER — Encounter: Payer: Self-pay | Admitting: Family Medicine

## 2020-10-27 DIAGNOSIS — M810 Age-related osteoporosis without current pathological fracture: Secondary | ICD-10-CM | POA: Insufficient documentation

## 2020-11-04 NOTE — Progress Notes (Addendum)
[facility] [place], [place], [place] [address] [phone]  Date:  11/08/2020   Name:  Lewis Grivas   DOB:  [birth year]   MRN:  [id number]  PCP:  Darreld Mclean, MD    Chief Complaint: Hypertension   History of Present Illness:  Yvette [name] is a 70 y.o. 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   She plans to move in with her sister who lives in [place].  She is afraid her daughter will deny her access to her grandkids For the time being she declines any medication or counseling  Fortunately Carlen and her daughter have worked things out. She is able to see her grandkids still which is great, she is living with her sister who will be facing surgery soon.   Can give pneumovax- will update today  Flu done  covid booster done  shingrix-discussed this with patient, recommended series at pharmacy Some labs done in July- she is not fasting  Colon is UTD  We also discussed her recent bone density scan today-she does have osteoporosis.  We discussed starting treatment  ASSESSMENT: The BMD measured at [facility]% is 0.519 g/cm2 with a T-score of -2.7. This patient is considered osteoporotic according to [facility]) criteria.The scan quality is good. Lumbar spine was excluded due to advanced degenerative changes. Patient is not a candidate for FRAX assessment due to diagnosis of Osteoporosis.  She would like to start on fosamax   Patient Active Problem List   Diagnosis Date Noted  . Osteoporosis 10/27/2020  . Hyperlipidemia with  target LDL less than 100 05/26/2015  . Morbid obesity ([place]) 03/16/2015  . Hypertension 02/17/2013  . GERD (gastroesophageal reflux disease) 02/17/2013    Past Medical History:  Diagnosis Date  . GERD (gastroesophageal reflux disease)   . Hypertension     Past Surgical History:  Procedure Laterality Date  . ABDOMINAL HYSTERECTOMY     total  . BREAST BIOPSY Right   . knee surgery Bilateral   . TONSILLECTOMY      Social History   Tobacco Use  . Smoking status: Never Smoker  . Smokeless tobacco: Never Used  Substance Use Topics  . Alcohol use: Yes  . Drug use: No    Family History  Problem Relation Age of Onset  . Alcohol abuse Mother   . Cirrhosis Mother   . Heart disease Father   . Parkinson's disease Brother     No Known Allergies  Medication list has been reviewed and updated.  Current Outpatient Medications on File Prior to Visit  Medication Sig Dispense Refill  . amLODipine (NORVASC) 10 MG tablet Take 1 tablet (10 mg total) by mouth daily. 30 tablet 0  . benazepril (LOTENSIN) 20 MG tablet Take 1 tablet (20 mg total) by mouth 2 (two) times daily. 180 tablet 1  . diclofenac sodium (VOLTAREN) 1 % GEL Apply 4 g topically 4 (four) times daily. 500 g 1  . fluticasone (FLONASE) 50 MCG/ACT nasal spray Place 1 spray into both nostrils daily.    . metoprolol succinate (  TOPROL-XL) 25 MG 24 hr tablet Take 1 tablet (25 mg total) by mouth daily. 90 tablet 3  . omeprazole (PRILOSEC) 20 MG capsule Take 1 capsule (20 mg total) by mouth daily. 30 capsule 0   No current facility-administered medications on file prior to visit.    Review of Systems:  As per HPI- otherwise negative.   Physical Examination: Vitals:   11/08/20 1323  BP: 124/78  Pulse: 71  Resp: 18  Temp: 98.5 F (36.9 C)  SpO2: 98%   Vitals:   11/08/20 1323  Weight: 221 lb (100.2 kg)  Height: 5\' 3"  (1.6 m)   Body mass index is 39.15 kg/m. Ideal Body Weight: Weight in (lb) to have BMI = 25:  140.8  GEN: no acute distress.  Obese, otherwise looks well HEENT: Atraumatic, Normocephalic.   Bilateral TM wnl, oropharynx normal.  PEERL,EOMI.   Ears and Nose: No external deformity. CV: RRR, No M/G/R. No JVD. No thrill. No extra heart sounds. PULM: CTA B, no wheezes, crackles, rhonchi. No retractions. No resp. distress. No accessory muscle use. ABD: S, NT, ND, +BS. No rebound. No HSM. EXTR: No c/c/e PSYCH: Normally interactive. Conversant.   Assessment and Plan: Essential hypertension - Plan: CBC, Comprehensive metabolic panel, amLODipine (NORVASC) 10 MG tablet  Hyperlipidemia, unspecified hyperlipidemia type - Plan: Lipid panel  Screening for thyroid disorder - Plan: TSH  Encounter for vitamin deficiency screening - Plan: VITAMIN D 25 Hydroxy (Vit-D Deficiency, Fractures)  Fatigue, unspecified type - Plan: TSH, VITAMIN D 25 Hydroxy (Vit-D Deficiency, Fractures)  Screening for diabetes mellitus - Plan: Hemoglobin A1c  Gastroesophageal reflux disease without esophagitis - Plan: omeprazole (PRILOSEC) 20 MG capsule  Skin lesions - Plan: Ambulatory referral to Dermatology  Allergic rhinitis, unspecified seasonality, unspecified trigger - Plan: fluticasone (FLONASE) 50 MCG/ACT nasal spray  Immunization due - Plan: Pneumococcal polysaccharide vaccine 23-valent greater than or equal to 2yo subcutaneous/IM  Age-related osteoporosis without current pathological fracture - Plan: alendronate (FOSAMAX) 70 MG tablet  Patient is here today for a follow-up visit.  Blood pressures under good control, refilled medications Gave Pneumovax today Start Fosamax, explained we would plan to repeat bone density in about 2 years She has noted a few skin lesions on her body, mostly on her back.  These appear to be benign seborrheic keratoses.  She is interested in a full dermatology skin check which is certainly fine.  I will order this for her today We are glad that things are looking up with her  family situation! Will plan further follow- up pending labs.  This visit occurred during the SARS-CoV-2 public health emergency.  Safety protocols were in place, including screening questions prior to the visit, additional usage of staff PPE, and extensive cleaning of exam room while observing appropriate contact time as indicated for disinfecting solutions.    Signed Lamar Blinks, MD  Addendum 3/24, received her labs as below.  Message to patient  Results for orders placed or performed in visit on 11/08/20  CBC  Result Value Ref Range   WBC 6.5 4.0 - 10.5 K/uL   RBC 4.00 3.87 - 5.11 Mil/uL   Platelets 307.0 150.0 - 400.0 K/uL   Hemoglobin 12.8 12.0 - 15.0 g/dL   HCT 37.7 36.0 - 46.0 %   MCV 94.3 78.0 - 100.0 fl   MCHC 34.0 30.0 - 36.0 g/dL   RDW 13.6 11.5 - 15.5 %  Comprehensive metabolic panel  Result Value Ref Range   Sodium 140 135 - 145  mEq/L   Potassium 4.7 3.5 - 5.1 mEq/L   Chloride 104 96 - 112 mEq/L   CO2 25 19 - 32 mEq/L   Glucose, Bld 82 70 - 99 mg/dL   BUN 18 6 - 23 mg/dL   Creatinine, Ser 0.79 0.40 - 1.20 mg/dL   Total Bilirubin 0.3 0.2 - 1.2 mg/dL   Alkaline Phosphatase 74 39 - 117 U/L   AST 17 0 - 37 U/L   ALT 16 0 - 35 U/L   Total Protein 7.3 6.0 - 8.3 g/dL   Albumin 4.9 3.5 - 5.2 g/dL   GFR 75.92 >60.00 mL/min   Calcium 10.1 8.4 - 10.5 mg/dL  Hemoglobin A1c  Result Value Ref Range   Hgb A1c MFr Bld 5.6 4.6 - 6.5 %  Lipid panel  Result Value Ref Range   Cholesterol 258 (H) 0 - 200 mg/dL   Triglycerides 267.0 (H) 0.0 - 149.0 mg/dL   HDL 49.10 >39.00 mg/dL   VLDL 53.4 (H) 0.0 - 40.0 mg/dL   Total CHOL/HDL Ratio 5    NonHDL 209.29   TSH  Result Value Ref Range   TSH 2.00 0.35 - 4.50 uIU/mL  VITAMIN D 25 Hydroxy (Vit-D Deficiency, Fractures)  Result Value Ref Range   VITD 60.16 30.00 - 100.00 ng/mL  LDL cholesterol, direct  Result Value Ref Range   Direct LDL 169.0 mg/dL    The 10-year ASCVD risk score Mikey Bussing DC Jr., et al., 2013) is: 13%    Values used to calculate the score:     Age: 39 years     Sex: Female     Is Non-Hispanic African American: No     Diabetic: No     Tobacco smoker: No     Systolic Blood Pressure: 093 mmHg     Is BP treated: Yes     HDL Cholesterol: 49.1 mg/dL     Total Cholesterol: 258 mg/dL

## 2020-11-04 NOTE — Patient Instructions (Addendum)
Good to see you again today!  I will be in touch with your labs asap You got your pneumonia booster (pneumovax) today Consider getting your shingles vaccine (shingrix) at the pharmacy at your convenience  Take care!  Please see me in abuot 6 months

## 2020-11-08 ENCOUNTER — Other Ambulatory Visit: Payer: Self-pay

## 2020-11-08 ENCOUNTER — Ambulatory Visit (INDEPENDENT_AMBULATORY_CARE_PROVIDER_SITE_OTHER): Payer: Medicare PPO | Admitting: Family Medicine

## 2020-11-08 ENCOUNTER — Encounter: Payer: Self-pay | Admitting: Family Medicine

## 2020-11-08 VITALS — BP 124/78 | HR 71 | Temp 98.5°F | Resp 18 | Ht 63.0 in | Wt 221.0 lb

## 2020-11-08 DIAGNOSIS — R5383 Other fatigue: Secondary | ICD-10-CM | POA: Diagnosis not present

## 2020-11-08 DIAGNOSIS — Z23 Encounter for immunization: Secondary | ICD-10-CM | POA: Diagnosis not present

## 2020-11-08 DIAGNOSIS — E785 Hyperlipidemia, unspecified: Secondary | ICD-10-CM

## 2020-11-08 DIAGNOSIS — I10 Essential (primary) hypertension: Secondary | ICD-10-CM

## 2020-11-08 DIAGNOSIS — Z1321 Encounter for screening for nutritional disorder: Secondary | ICD-10-CM

## 2020-11-08 DIAGNOSIS — L989 Disorder of the skin and subcutaneous tissue, unspecified: Secondary | ICD-10-CM

## 2020-11-08 DIAGNOSIS — Z1329 Encounter for screening for other suspected endocrine disorder: Secondary | ICD-10-CM | POA: Diagnosis not present

## 2020-11-08 DIAGNOSIS — M810 Age-related osteoporosis without current pathological fracture: Secondary | ICD-10-CM

## 2020-11-08 DIAGNOSIS — J309 Allergic rhinitis, unspecified: Secondary | ICD-10-CM

## 2020-11-08 DIAGNOSIS — Z131 Encounter for screening for diabetes mellitus: Secondary | ICD-10-CM

## 2020-11-08 DIAGNOSIS — K219 Gastro-esophageal reflux disease without esophagitis: Secondary | ICD-10-CM

## 2020-11-08 MED ORDER — AMLODIPINE BESYLATE 10 MG PO TABS: 10.0000 mg | ORAL_TABLET | Freq: Every day | ORAL | 3 refills | Status: DC

## 2020-11-08 MED ORDER — ALENDRONATE SODIUM 70 MG PO TABS: 70.0000 mg | ORAL_TABLET | ORAL | 3 refills | Status: DC

## 2020-11-08 MED ORDER — FLUTICASONE PROPIONATE 50 MCG/ACT NA SUSP: 1.0000 | Freq: Every day | NASAL | 11 refills | Status: DC

## 2020-11-08 MED ORDER — OMEPRAZOLE 20 MG PO CPDR: 20.0000 mg | DELAYED_RELEASE_CAPSULE | Freq: Every day | ORAL | 3 refills | Status: DC

## 2020-11-09 ENCOUNTER — Encounter: Payer: Self-pay | Admitting: Family Medicine

## 2020-11-09 LAB — CBC
HCT: 37.7 % (ref 36.0–46.0)
Hemoglobin: 12.8 g/dL (ref 12.0–15.0)
MCHC: 34 g/dL (ref 30.0–36.0)
MCV: 94.3 fl (ref 78.0–100.0)
Platelets: 307 10*3/uL (ref 150.0–400.0)
RBC: 4 Mil/uL (ref 3.87–5.11)
RDW: 13.6 % (ref 11.5–15.5)
WBC: 6.5 10*3/uL (ref 4.0–10.5)

## 2020-11-09 LAB — COMPREHENSIVE METABOLIC PANEL
ALT: 16 U/L (ref 0–35)
AST: 17 U/L (ref 0–37)
Albumin: 4.9 g/dL (ref 3.5–5.2)
Alkaline Phosphatase: 74 U/L (ref 39–117)
BUN: 18 mg/dL (ref 6–23)
CO2: 25 mEq/L (ref 19–32)
Calcium: 10.1 mg/dL (ref 8.4–10.5)
Chloride: 104 mEq/L (ref 96–112)
Creatinine, Ser: 0.79 mg/dL (ref 0.40–1.20)
GFR: 75.92 mL/min (ref >60.00)
Glucose, Bld: 82 mg/dL (ref 70–99)
Potassium: 4.7 mEq/L (ref 3.5–5.1)
Sodium: 140 mEq/L (ref 135–145)
Total Bilirubin: 0.3 mg/dL (ref 0.2–1.2)
Total Protein: 7.3 g/dL (ref 6.0–8.3)

## 2020-11-09 LAB — LIPID PANEL
Cholesterol: 258 mg/dL — ABNORMAL HIGH (ref 0–200)
HDL: 49.1 mg/dL (ref >39.00)
NonHDL: 209.29
Total CHOL/HDL Ratio: 5
Triglycerides: 267 mg/dL — ABNORMAL HIGH (ref 0.0–149.0)
VLDL: 53.4 mg/dL — ABNORMAL HIGH (ref 0.0–40.0)

## 2020-11-09 LAB — VITAMIN D 25 HYDROXY (VIT D DEFICIENCY, FRACTURES): VITD: 60.16 ng/mL (ref 30.00–100.00)

## 2020-11-09 LAB — HEMOGLOBIN A1C: Hgb A1c MFr Bld: 5.6 % (ref 4.6–6.5)

## 2020-11-09 LAB — LDL CHOLESTEROL, DIRECT: Direct LDL: 169 mg/dL

## 2020-11-09 LAB — TSH: TSH: 2 u[IU]/mL (ref 0.35–4.50)

## 2021-03-29 ENCOUNTER — Ambulatory Visit: Payer: Medicare PPO

## 2021-03-29 ENCOUNTER — Telehealth: Payer: Self-pay

## 2021-03-29 NOTE — Telephone Encounter (Signed)
Attempted x 3 to reach patient for her 1:00 phone visit. Left message for pt to call back.

## 2021-03-29 NOTE — Progress Notes (Deleted)
Subjective:   Yvette [name] is a 70 y.o. female who presents for Medicare Annual (Subsequent) preventive examination.  I connected with *** today by telephone and verified that I am speaking with the correct person using two identifiers. Location patient: home Location provider: work Persons participating in the virtual visit: patient, Marine scientist.    I discussed the limitations, risks, security and privacy concerns of performing an evaluation and management service by telephone and the availability of in person appointments. I also discussed with the patient that there may be a patient responsible charge related to this service. The patient expressed understanding and verbally consented to this telephonic visit.    Interactive audio and video telecommunications were attempted between this provider and patient, however failed, due to patient having technical difficulties OR patient did not have access to video capability.  We continued and completed visit with audio only.  Some vital signs may be absent or patient reported.   Time Spent with patient on telephone encounter: *** minutes    Review of Systems    ***       Objective:    There were no vitals filed for this visit. There is no height or weight on file to calculate BMI.  Advanced Directives 09/26/2014  Does Patient Have a Medical Advance Directive? No  Does patient want to make changes to medical advance directive? Yes - information given    Current Medications (verified) Outpatient Encounter Medications as of 03/29/2021  Medication Sig   alendronate (FOSAMAX) 70 MG tablet Take 1 tablet (70 mg total) by mouth every 7 (seven) days. Take with a full glass of water on an empty stomach.   amLODipine (NORVASC) 10 MG tablet Take 1 tablet (10 mg total) by mouth daily.   benazepril (LOTENSIN) 20 MG tablet Take 1 tablet (20 mg total) by mouth 2 (two) times daily.   diclofenac sodium (VOLTAREN) 1 % GEL Apply 4 g topically 4 (four)  times daily.   fluticasone (FLONASE) 50 MCG/ACT nasal spray Place 1 spray into both nostrils daily.   metoprolol succinate (TOPROL-XL) 25 MG 24 hr tablet Take 1 tablet (25 mg total) by mouth daily.   omeprazole (PRILOSEC) 20 MG capsule Take 1 capsule (20 mg total) by mouth daily.   No facility-administered encounter medications on file as of 03/29/2021.    Allergies (verified) Patient has no known allergies.   History: Past Medical History:  Diagnosis Date   GERD (gastroesophageal reflux disease)    Hypertension    Past Surgical History:  Procedure Laterality Date   ABDOMINAL HYSTERECTOMY     total   BREAST BIOPSY Right    knee surgery Bilateral    TONSILLECTOMY     Family History  Problem Relation Age of Onset   Alcohol abuse Mother    Cirrhosis Mother    Heart disease Father    Parkinson's disease Brother    Social History   Socioeconomic History   Marital status: Divorced    Spouse name: Not on file   Number of children: Not on file   Years of education: Not on file   Highest education level: Not on file  Occupational History   Not on file  Tobacco Use   Smoking status: Never   Smokeless tobacco: Never  Substance and Sexual Activity   Alcohol use: Yes   Drug use: No   Sexual activity: Not on file  Other Topics Concern   Not on file  Social History Narrative   Not  on file   Social Determinants of Health   Financial Resource Strain: Not on file  Food Insecurity: Not on file  Transportation Needs: Not on file  Physical Activity: Not on file  Stress: Not on file  Social Connections: Not on file    Tobacco Counseling Counseling given: Not Answered   Clinical Intake:                 Diabetic?No         Activities of Daily Living No flowsheet data found.  Patient Care Team: Copland, Gay Filler, MD as PCP - General (Family Medicine)  Indicate any recent Medical Services you may have received from other than [facility] providers in the  past year (date may be approximate).     Assessment:   This is a routine wellness examination for Yvette [name].  Hearing/Vision screen No results found.  Dietary issues and exercise activities discussed:     Goals Addressed   None    Depression Screen PHQ 2/9 Scores 11/29/2019 11/24/2015 05/26/2015 09/26/2014 07/19/2014  PHQ - 2 Score 0 1 0 0 0    Fall Risk Fall Risk  11/29/2019 11/24/2015 05/26/2015 09/26/2014 07/19/2014  Falls in the past year? 0 Yes Yes No No  Number falls in past yr: - 1 1 - -  Injury with Fall? - No No - -    FALL RISK PREVENTION PERTAINING TO THE HOME:  Any stairs in or around the home? {YES/NO:21197} If so, are there any without handrails? {YES/NO:21197} Home free of loose throw rugs in walkways, pet beds, electrical cords, etc? {YES/NO:21197} Adequate lighting in your home to reduce risk of falls? {YES/NO:21197}  ASSISTIVE DEVICES UTILIZED TO PREVENT FALLS:  Life alert? {YES/NO:21197} Use of a cane, walker or w/c? {YES/NO:21197} Grab bars in the bathroom? {YES/NO:21197} Shower chair or bench in shower? {YES/NO:21197} Elevated toilet seat or a handicapped toilet? {YES/NO:21197}  TIMED UP AND GO:  Was the test performed? {YES/NO:21197}.  Length of time to ambulate 10 feet: *** sec.   {Appearance of YN:9739091  Cognitive Function:        Immunizations Immunization History  Administered Date(s) Administered   Influenza, High Dose Seasonal PF 05/31/2016   Influenza,inj,Quad PF,6+ Mos 05/26/2015   Influenza-Unspecified 05/31/2020   PFIZER(Purple Top)SARS-COV-2 Vaccination 10/15/2019, 11/16/2019, 05/31/2020   Pneumococcal Conjugate-13 11/24/2015   Pneumococcal Polysaccharide-23 11/08/2020   Tdap 02/17/2013   Zoster, Live 03/03/2015    TDAP status: Up to date  Flu Vaccine status: Up to date  Pneumococcal vaccine status: Up to date  {Covid-19 vaccine status:2101808}  Qualifies for Shingles Vaccine? Yes   Zostavax completed Yes   Shingrix  Completed?: No.    Education has been provided regarding the importance of this vaccine. Patient has been advised to call insurance company to determine out of pocket expense if they have not yet received this vaccine. Advised may also receive vaccine at local pharmacy or Health Dept. Verbalized acceptance and understanding.  Screening Tests Health Maintenance  Topic Date Due   Zoster Vaccines- Shingrix (1 of 2) Never done   COVID-19 Vaccine (4 - Booster for Pfizer series) 10/01/2020   INFLUENZA VACCINE  03/19/2021   MAMMOGRAM  10/27/2022   TETANUS/TDAP  02/18/2023   COLONOSCOPY (Pts 45-73yr Insurance coverage will need to be confirmed)  03/20/2027   DEXA SCAN  Completed   Hepatitis C Screening  Completed   PNA vac Low Risk Adult  Completed   HPV VACCINES  Aged Out    Health Maintenance  Health Maintenance Due  Topic Date Due   Zoster Vaccines- Shingrix (1 of 2) Never done   COVID-19 Vaccine (4 - Booster for Pfizer series) 10/01/2020   INFLUENZA VACCINE  03/19/2021    Colorectal cancer screening: Type of screening: Colonoscopy. Completed 03/19/2017. Repeat every 10 years  Mammogram status: Completed Bilateral 10/26/2020. Repeat every year  Bone Density status: Completed 10/26/2020. Results reflect: Bone density results: OSTEOPOROSIS. Repeat every 2 years.  Lung Cancer Screening: (Low Dose CT Chest recommended if Age 41-80 years, 30 pack-year currently smoking OR have quit w/in 15years.) does not qualify.     Additional Screening:  Hepatitis C Screening: Completed 11/24/2015  Vision Screening: Recommended annual ophthalmology exams for early detection of glaucoma and other disorders of the eye. Is the patient up to date with their annual eye exam?  {YES/NO:21197} Who is the provider or what is the name of the office in which the patient attends annual eye exams? *** If pt is not established with a provider, would they like to be referred to a provider to establish care?  {YES/NO:21197}.   Dental Screening: Recommended annual dental exams for proper oral hygiene  Community Resource Referral / Chronic Care Management: CRR required this visit?  {YES/NO:21197}  CCM required this visit?  {YES/NO:21197}     Plan:     I have personally reviewed and noted the following in the patient's chart:   Medical and social history Use of alcohol, tobacco or illicit drugs  Current medications and supplements including opioid prescriptions.  Functional ability and status Nutritional status Physical activity Advanced directives List of other physicians Hospitalizations, surgeries, and ER visits in previous 12 months Vitals Screenings to include cognitive, depression, and falls Referrals and appointments  In addition, I have reviewed and discussed with patient certain preventive protocols, quality metrics, and best practice recommendations. A written personalized care plan for preventive services as well as general preventive health recommendations were provided to patient.   Due to this being a telephonic visit, the after visit summary with patients personalized plan was offered to patient via mail or my-chart. ***Patient declined at this time./ Patient would like to access on my-chart/ per request, patient was mailed a copy of AVS./ Patient preferred to pick up at office at next visit.   Marta Antu, LPN   [phone]  Nurse Health Advisor  Nurse Notes: ***

## 2021-04-10 ENCOUNTER — Ambulatory Visit (INDEPENDENT_AMBULATORY_CARE_PROVIDER_SITE_OTHER): Payer: Medicare PPO

## 2021-04-10 VITALS — Ht 63.0 in | Wt 221.0 lb

## 2021-04-10 DIAGNOSIS — Z0000 Encounter for general adult medical examination without abnormal findings: Secondary | ICD-10-CM

## 2021-04-10 NOTE — Patient Instructions (Signed)
[name] , Thank you for taking time to complete your Medicare Wellness Visit. I appreciate your ongoing commitment to your health goals. Please review the following plan we discussed and let me know if I can assist you in the future.   Screening recommendations/referrals: Colonoscopy: Completed 03/19/2017-Due 03/20/2027 Mammogram: Completed 10/26/2020-Due 10/27/2022 Bone Density: Completed 10/26/2020-Due 10/27/2022 Recommended yearly ophthalmology/optometry visit for glaucoma screening and checkup Recommended yearly dental visit for hygiene and checkup  Vaccinations: Influenza vaccine: Due 04/2021 Pneumococcal vaccine: Up to date Tdap vaccine: Up to date-02/18/2023 Shingles vaccine: Discuss with pharmacy   Covid-19:Up to date  Advanced directives: Information mailed today  Conditions/risks identified: See problem list  Next appointment: Follow up in one year for your annual wellness visit 04/15/2022 @ 1:00   Preventive Care 65 Years and Older, Female Preventive care refers to lifestyle choices and visits with your health care provider that can promote health and wellness. What does preventive care include? A yearly physical exam. This is also called an annual well check. Dental exams once or twice a year. Routine eye exams. Ask your health care provider how often you should have your eyes checked. Personal lifestyle choices, including: Daily care of your teeth and gums. Regular physical activity. Eating a healthy diet. Avoiding tobacco and drug use. Limiting alcohol use. Practicing safe sex. Taking low-dose aspirin every day. Taking vitamin and mineral supplements as recommended by your health care provider. What happens during an annual well check? The services and screenings done by your health care provider during your annual well check will depend on your age, overall health, lifestyle risk factors, and family history of disease. Counseling  Your health care provider may ask  you questions about your: Alcohol use. Tobacco use. Drug use. Emotional well-being. Home and relationship well-being. Sexual activity. Eating habits. History of falls. Memory and ability to understand (cognition). Work and work Statistician. Reproductive health. Screening  You may have the following tests or measurements: Height, weight, and BMI. Blood pressure. Lipid and cholesterol levels. These may be checked every 5 years, or more frequently if you are over 54 years old. Skin check. Lung cancer screening. You may have this screening every year starting at age 54 if you have a 30-pack-year history of smoking and currently smoke or have quit within the past 15 years. Fecal occult blood test (FOBT) of the stool. You may have this test every year starting at age 23. Flexible sigmoidoscopy or colonoscopy. You may have a sigmoidoscopy every 5 years or a colonoscopy every 10 years starting at age 28. Hepatitis C blood test. Hepatitis B blood test. Sexually transmitted disease (STD) testing. Diabetes screening. This is done by checking your blood sugar (glucose) after you have not eaten for a while (fasting). You may have this done every 1-3 years. Bone density scan. This is done to screen for osteoporosis. You may have this done starting at age 1. Mammogram. This may be done every 1-2 years. Talk to your health care provider about how often you should have regular mammograms. Talk with your health care provider about your test results, treatment options, and if necessary, the need for more tests. Vaccines  Your health care provider may recommend certain vaccines, such as: Influenza vaccine. This is recommended every year. Tetanus, diphtheria, and acellular pertussis (Tdap, Td) vaccine. You may need a Td booster every 10 years. Zoster vaccine. You may need this after age 33. Pneumococcal 13-valent conjugate (PCV13) vaccine. One dose is recommended after age 62. Pneumococcal  polysaccharide (  PPSV23) vaccine. One dose is recommended after age 50. Talk to your health care provider about which screenings and vaccines you need and how often you need them. This information is not intended to replace advice given to you by your health care provider. Make sure you discuss any questions you have with your health care provider. Document Released: 09/01/2015 Document Revised: 04/24/2016 Document Reviewed: 06/06/2015 Elsevier Interactive Patient Education  2017 [place] Prevention in the Home Falls can cause injuries. They can happen to people of all ages. There are many things you can do to make your home safe and to help prevent falls. What can I do on the outside of my home? Regularly fix the edges of walkways and driveways and fix any cracks. Remove anything that might make you trip as you walk through a door, such as a raised step or threshold. Trim any bushes or trees on the path to your home. Use bright outdoor lighting. Clear any walking paths of anything that might make someone trip, such as rocks or tools. Regularly check to see if handrails are loose or broken. Make sure that both sides of any steps have handrails. Any raised decks and porches should have guardrails on the edges. Have any leaves, snow, or ice cleared regularly. Use sand or salt on walking paths during winter. Clean up any spills in your garage right away. This includes oil or grease spills. What can I do in the bathroom? Use night lights. Install grab bars by the toilet and in the tub and shower. Do not use towel bars as grab bars. Use non-skid mats or decals in the tub or shower. If you need to sit down in the shower, use a plastic, non-slip stool. Keep the floor dry. Clean up any water that spills on the floor as soon as it happens. Remove soap buildup in the tub or shower regularly. Attach bath mats securely with double-sided non-slip rug tape. Do not have throw rugs and other  things on the floor that can make you trip. What can I do in the bedroom? Use night lights. Make sure that you have a light by your bed that is easy to reach. Do not use any sheets or blankets that are too big for your bed. They should not hang down onto the floor. Have a firm chair that has side arms. You can use this for support while you get dressed. Do not have throw rugs and other things on the floor that can make you trip. What can I do in the kitchen? Clean up any spills right away. Avoid walking on wet floors. Keep items that you use a lot in easy-to-reach places. If you need to reach something above you, use a strong step stool that has a grab bar. Keep electrical cords out of the way. Do not use floor polish or wax that makes floors slippery. If you must use wax, use non-skid floor wax. Do not have throw rugs and other things on the floor that can make you trip. What can I do with my stairs? Do not leave any items on the stairs. Make sure that there are handrails on both sides of the stairs and use them. Fix handrails that are broken or loose. Make sure that handrails are as long as the stairways. Check any carpeting to make sure that it is firmly attached to the stairs. Fix any carpet that is loose or worn. Avoid having throw rugs at the top or bottom  of the stairs. If you do have throw rugs, attach them to the floor with carpet tape. Make sure that you have a light switch at the top of the stairs and the bottom of the stairs. If you do not have them, ask someone to add them for you. What else can I do to help prevent falls? Wear shoes that: Do not have high heels. Have rubber bottoms. Are comfortable and fit you well. Are closed at the toe. Do not wear sandals. If you use a stepladder: Make sure that it is fully opened. Do not climb a closed stepladder. Make sure that both sides of the stepladder are locked into place. Ask someone to hold it for you, if possible. Clearly  mark and make sure that you can see: Any grab bars or handrails. First and last steps. Where the edge of each step is. Use tools that help you move around (mobility aids) if they are needed. These include: Canes. Walkers. Scooters. Crutches. Turn on the lights when you go into a dark area. Replace any light bulbs as soon as they burn out. Set up your furniture so you have a clear path. Avoid moving your furniture around. If any of your floors are uneven, fix them. If there are any pets around you, be aware of where they are. Review your medicines with your doctor. Some medicines can make you feel dizzy. This can increase your chance of falling. Ask your doctor what other things that you can do to help prevent falls. This information is not intended to replace advice given to you by your health care provider. Make sure you discuss any questions you have with your health care provider. Document Released: 06/01/2009 Document Revised: 01/11/2016 Document Reviewed: 09/09/2014 Elsevier Interactive Patient Education  2017 [employer].

## 2021-04-10 NOTE — Progress Notes (Signed)
Subjective:   Yvette [name] is a 70 y.o. female who presents for Medicare Annual initial preventive examination.  I connected with Allaina today by telephone and verified that I am speaking with the correct person using two identifiers. Location patient: home Location provider: work Persons participating in the virtual visit: patient, Marine scientist.    I discussed the limitations, risks, security and privacy concerns of performing an evaluation and management service by telephone and the availability of in person appointments. I also discussed with the patient that there may be a patient responsible charge related to this service. The patient expressed understanding and verbally consented to this telephonic visit.    Interactive audio and video telecommunications were attempted between this provider and patient, however failed, due to patient having technical difficulties OR patient did not have access to video capability.  We continued and completed visit with audio only.  Some vital signs may be absent or patient reported.   Time Spent with patient on telephone encounter: 20 minutes   Review of Systems     Cardiac Risk Factors include: advanced age (>35mn, >>36women);hypertension;dyslipidemia;obesity (BMI >30kg/m2)     Objective:    Today's Vitals   04/10/21 1300  Weight: 221 lb (100.2 kg)  Height: '5\' 3"'$  (1.6 m)  PainSc: 2    Body mass index is 39.15 kg/m.  Advanced Directives 04/10/2021 09/26/2014  Does Patient Have a Medical Advance Directive? No No  Does patient want to make changes to medical advance directive? - Yes - information given  Would patient like information on creating a medical advance directive? Yes (MAU/Ambulatory/Procedural Areas - Information given) -    Current Medications (verified) Outpatient Encounter Medications as of 04/10/2021  Medication Sig   alendronate (FOSAMAX) 70 MG tablet Take 1 tablet (70 mg total) by mouth every 7 (seven) days. Take with a full  glass of water on an empty stomach.   amLODipine (NORVASC) 10 MG tablet Take 1 tablet (10 mg total) by mouth daily.   benazepril (LOTENSIN) 20 MG tablet Take 1 tablet (20 mg total) by mouth 2 (two) times daily.   diclofenac sodium (VOLTAREN) 1 % GEL Apply 4 g topically 4 (four) times daily.   fluticasone (FLONASE) 50 MCG/ACT nasal spray Place 1 spray into both nostrils daily.   metoprolol succinate (TOPROL-XL) 25 MG 24 hr tablet Take 1 tablet (25 mg total) by mouth daily.   omeprazole (PRILOSEC) 20 MG capsule Take 1 capsule (20 mg total) by mouth daily.   No facility-administered encounter medications on file as of 04/10/2021.    Allergies (verified) Patient has no known allergies.   History: Past Medical History:  Diagnosis Date   GERD (gastroesophageal reflux disease)    Hypertension    Past Surgical History:  Procedure Laterality Date   ABDOMINAL HYSTERECTOMY     total   BREAST BIOPSY Right    knee surgery Bilateral    TONSILLECTOMY     Family History  Problem Relation Age of Onset   Alcohol abuse Mother    Cirrhosis Mother    Heart disease Father    Parkinson's disease Brother    Social History   Socioeconomic History   Marital status: Divorced    Spouse name: Not on file   Number of children: Not on file   Years of education: Not on file   Highest education level: Not on file  Occupational History   Not on file  Tobacco Use   Smoking status: Never   Smokeless  tobacco: Never  Substance and Sexual Activity   Alcohol use: Yes   Drug use: No   Sexual activity: Not on file  Other Topics Concern   Not on file  Social History Narrative   Not on file   Social Determinants of Health   Financial Resource Strain: Low Risk    Difficulty of Paying Living Expenses: Not hard at all  Food Insecurity: No Food Insecurity   Worried About Running Out of Food in the Last Year: Never true   [place] in the Last Year: Never true  Transportation Needs: No  Transportation Needs   Lack of Transportation (Medical): No   Lack of Transportation (Non-Medical): No  Physical Activity: Inactive   Days of Exercise per Week: 0 days   Minutes of Exercise per Session: 0 min  Stress: No Stress Concern Present   Feeling of Stress : Not at all  Social Connections: Socially Isolated   Frequency of Communication with Friends and Family: More than three times a week   Frequency of Social Gatherings with Friends and Family: More than three times a week   Attends Religious Services: Never   Marine scientist or Organizations: No   Attends Music therapist: Never   Marital Status: Divorced    Tobacco Counseling Counseling given: Not Answered   Clinical Intake:  Pre-visit preparation completed: Yes  Pain : 0-10 Pain Score: 2  Pain Type: Chronic pain Pain Location: Wrist Pain Orientation: Left Pain Onset: More than a month ago Pain Frequency: Constant Pain Relieving Factors: Naproxen & Tylenol  Pain Relieving Factors: Naproxen & Tylenol  Nutritional Status: BMI > 30  Obese Nutritional Risks: None Diabetes: No  How often do you need to have someone help you when you read instructions, pamphlets, or other written materials from your doctor or pharmacy?: 1 - Never  Diabetic?NO  Interpreter Needed?: No  Information entered by :: Caroleen Hamman LPN   Activities of Daily Living In your present state of health, do you have any difficulty performing the following activities: 04/10/2021  Hearing? N  Vision? N  Difficulty concentrating or making decisions? Y  Comment occasionally forgets a word  Walking or climbing stairs? N  Dressing or bathing? N  Doing errands, shopping? N  Preparing Food and eating ? N  Using the Toilet? N  In the past six months, have you accidently leaked urine? Y  Comment occasionally with coughing or sneezing  Do you have problems with loss of bowel control? N  Managing your Medications? N   Managing your Finances? N  Housekeeping or managing your Housekeeping? N  Some recent data might be hidden    Patient Care Team: Copland, Gay Filler, MD as PCP - General (Family Medicine)  Indicate any recent Medical Services you may have received from other than [facility] providers in the past year (date may be approximate).     Assessment:   This is a routine wellness examination for Yvette [name].  Hearing/Vision screen Hearing Screening - Comments:: Age related hearing loss per patient Vision Screening - Comments:: Last eye exam-04/2020-Dr.Weaver Wears glasses  Dietary issues and exercise activities discussed: Current Exercise Habits: The patient does not participate in regular exercise at present, Exercise limited by: None identified   Goals Addressed             This Visit's Progress    Patient Stated       Eat healthier, drink more water & increase activity  Depression Screen PHQ 2/9 Scores 04/10/2021 11/29/2019 11/24/2015 05/26/2015 09/26/2014 07/19/2014  PHQ - 2 Score 0 0 1 0 0 0    Fall Risk Fall Risk  04/10/2021 11/29/2019 11/24/2015 05/26/2015 09/26/2014  Falls in the past year? 1 0 Yes Yes No  Number falls in past yr: 1 - 1 1 -  Injury with Fall? 0 - No No -  Risk for fall due to : History of fall(s) - - - -  Follow up Falls prevention discussed - - - -    FALL RISK PREVENTION PERTAINING TO THE HOME:  Any stairs in or around the home? Yes  If so, are there any without handrails? No  Home free of loose throw rugs in walkways, pet beds, electrical cords, etc? Yes  Adequate lighting in your home to reduce risk of falls? Yes   ASSISTIVE DEVICES UTILIZED TO PREVENT FALLS:  Life alert? No  Use of a cane, walker or w/c? No  Grab bars in the bathroom? Yes  Shower chair or bench in shower? No  Elevated toilet seat or a handicapped toilet? No   TIMED UP AND GO:  Was the test performed? No . Phone visit   Cognitive Function:Normal cognitive status assessed by this Nurse  Health Advisor. No abnormalities found.          Immunizations Immunization History  Administered Date(s) Administered   Influenza, High Dose Seasonal PF 05/31/2016   Influenza,inj,Quad PF,6+ Mos 05/26/2015   Influenza-Unspecified 05/31/2020   PFIZER(Purple Top)SARS-COV-2 Vaccination 10/15/2019, 11/16/2019, 05/31/2020, 01/04/2021   Pneumococcal Conjugate-13 11/24/2015   Pneumococcal Polysaccharide-23 11/08/2020   Tdap 02/17/2013   Zoster, Live 03/03/2015    TDAP status: Up to date  Flu Vaccine status: Up to date  Pneumococcal vaccine status: Up to date  Covid-19 vaccine status: Completed vaccines  Qualifies for Shingles Vaccine? Yes   Zostavax completed Yes   Shingrix Completed?: No.    Education has been provided regarding the importance of this vaccine. Patient has been advised to call insurance company to determine out of pocket expense if they have not yet received this vaccine. Advised may also receive vaccine at local pharmacy or Health Dept. Verbalized acceptance and understanding.  Screening Tests Health Maintenance  Topic Date Due   Zoster Vaccines- Shingrix (1 of 2) Never done   INFLUENZA VACCINE  03/19/2021   MAMMOGRAM  10/27/2022   TETANUS/TDAP  02/18/2023   COLONOSCOPY (Pts 45-41yr Insurance coverage will need to be confirmed)  03/20/2027   DEXA SCAN  Completed   COVID-19 Vaccine  Completed   Hepatitis C Screening  Completed   PNA vac Low Risk Adult  Completed   HPV VACCINES  Aged Out    Health Maintenance  Health Maintenance Due  Topic Date Due   Zoster Vaccines- Shingrix (1 of 2) Never done   INFLUENZA VACCINE  03/19/2021    Colorectal cancer screening: Type of screening: Colonoscopy. Completed 03/19/2017. Repeat every 10 years  Mammogram status: Completed Bilateral 10/26/2020. Repeat every year  Bone Density status: Completed 10/26/2020. Results reflect: Bone density results: OSTEOPOROSIS. Repeat every 2 years.  Lung Cancer Screening: (Low Dose  CT Chest recommended if Age 70-80years, 30 pack-year currently smoking OR have quit w/in 15years.) does not qualify.     Additional Screening:  Hepatitis C Screening: Completed 11/24/2015  Vision Screening: Recommended annual ophthalmology exams for early detection of glaucoma and other disorders of the eye. Is the patient up to date with their annual eye exam?  Yes  Who is the provider or what is the name of the office in which the patient attends annual eye exams? Dr. Kathlen Mody   Dental Screening: Recommended annual dental exams for proper oral hygiene  Community Resource Referral / Chronic Care Management: CRR required this visit?  No   CCM required this visit?  No      Plan:     I have personally reviewed and noted the following in the patient's chart:   Medical and social history Use of alcohol, tobacco or illicit drugs  Current medications and supplements including opioid prescriptions.  Functional ability and status Nutritional status Physical activity Advanced directives List of other physicians Hospitalizations, surgeries, and ER visits in previous 12 months Vitals Screenings to include cognitive, depression, and falls Referrals and appointments  In addition, I have reviewed and discussed with patient certain preventive protocols, quality metrics, and best practice recommendations. A written personalized care plan for preventive services as well as general preventive health recommendations were provided to patient.   Due to this being a telephonic visit, the after visit summary with patients personalized plan was offered to patient via mail or my-chart. Patient would like to access on my-chart.   Marta Antu, LPN   [id number]  Nurse Health Advisor  Nurse Notes: None

## 2021-04-11 ENCOUNTER — Ambulatory Visit (INDEPENDENT_AMBULATORY_CARE_PROVIDER_SITE_OTHER): Payer: Medicare PPO | Admitting: Dermatology

## 2021-04-11 ENCOUNTER — Other Ambulatory Visit: Payer: Self-pay

## 2021-04-11 DIAGNOSIS — L821 Other seborrheic keratosis: Secondary | ICD-10-CM | POA: Diagnosis not present

## 2021-04-11 DIAGNOSIS — L219 Seborrheic dermatitis, unspecified: Secondary | ICD-10-CM

## 2021-04-11 DIAGNOSIS — Z1283 Encounter for screening for malignant neoplasm of skin: Secondary | ICD-10-CM

## 2021-04-11 DIAGNOSIS — D1800 Hemangioma unspecified site: Secondary | ICD-10-CM | POA: Diagnosis not present

## 2021-04-11 DIAGNOSIS — L738 Other specified follicular disorders: Secondary | ICD-10-CM

## 2021-04-11 DIAGNOSIS — L719 Rosacea, unspecified: Secondary | ICD-10-CM

## 2021-04-11 MED ORDER — IVERMECTIN 1 % EX CREA: TOPICAL_CREAM | CUTANEOUS | 6 refills | Status: DC

## 2021-04-12 MED ORDER — METRONIDAZOLE 0.75 % EX GEL: CUTANEOUS | 2 refills | Status: DC

## 2021-04-23 ENCOUNTER — Encounter: Payer: Self-pay | Admitting: Dermatology

## 2021-04-23 NOTE — Progress Notes (Signed)
   Follow-Up Visit   Subjective  Yvette [name] is a 70 y.o. female who presents for the following: Annual Exam (Here for skin exam. Concerns are face and back. No history of skin cancers. ).  General skin examination, several spots to check Location:  Duration:  Quality:  Associated Signs/Symptoms: Modifying Factors:  Severity:  Timing: Context:   Objective  Well appearing patient in no apparent distress; mood and affect are within normal limits. Torso - Posterior (Back) Full body examination: No atypical pigmented lesions or nonmelanoma skin cancer.  Left Upper Back Flat textured brown 6 mm papule  Mid Back Multiple 2 mm smooth red dermal papules  Right Forehead 2 mm flesh-colored delled papule; typical dermoscopy  Left Malar Cheek, Mid Forehead, Right Malar Cheek Central facial slightly inflamed erythema compatible with rosacea    A full examination was performed including scalp, head, eyes, ears, nose, lips, neck, chest, axillae, abdomen, back, buttocks, bilateral upper extremities, bilateral lower extremities, hands, feet, fingers, toes, fingernails, and toenails. All findings within normal limits unless otherwise noted below.  Areas beneath undergarments not fully examined.   Assessment & Plan    Encounter for screening for malignant neoplasm of skin Torso - Posterior (Back)  Annual skin examination, encouraged to self examine twice annually.  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  Call if this is not helping.      I, Lavonna Monarch, MD, have reviewed all documentation for this visit.  The documentation on 04/23/21 for the exam, diagnosis, procedures, and orders are all accurate and complete.

## 2021-04-24 ENCOUNTER — Telehealth: Payer: Self-pay

## 2021-04-24 NOTE — Telephone Encounter (Signed)
Patient evaluated/treated in ER.    [facility] Night - Client TELEPHONE ADVICE RECORD AccessNurse PatientName:Yvette [name] Gender: Female DOB: 16-Feb-1951 Age: 70 Y 53 M 18 D [phone](Primary),[phone](Secondary) [facility] - Client Client Site [facility] - Night Physician Copland, Janett Billow- MD Contact Type Call Who Is Calling Patient / Member / Family / Caregiver Call Type Triage / Clinical Relationship To Patient Self Return Phone Number [phone] (Primary) Chief Complaint Hand or Wrist Injury Reason for Call Symptomatic / Request for Amoret states she accidentally hit her left hand on the corner with a gas meter and is concerned with how fast her hand swelled up right away. She's been icing it since and the swelling has gone down. [facility] Translation No Nurse Assessment Nurse: Ricard Dillon, RN, Shelda Jakes Date/Time Eilene Ghazi Time): 04/22/2021 1:37:11 PM Confirm and document reason for call. If symptomatic, describe symptoms. ---Caller states she accidentally hit her left hand on the corner with a gas meter and is concerned with how fast her hand swelled up right away. Does the patient have any new or worsening symptoms? ---Yes Will a triage be completed? ---Yes Related visit to physician within the last 2 weeks? ---No Does the PT have any chronic conditions? (i.e. diabetes, asthma, this includes High risk factors for pregnancy, etc.) ---Yes List chronic conditions. ---Hypertension Arthritis Is this a behavioral health or substance abuse call? ---No Guidelines Guideline Title Affirmed Question Affirmed Notes Nurse Date/Time (Eastern Time) Hand and Wrist Injury [1] Large swelling or bruise (> 2 inches or 5 cm) AND [2] can't use injured hand normally (e.g., make a fist, open fully,  hold a glass of water) Ricard Dillon, RN, Shelda Jakes 04/22/2021 1:39:36 PM PLEASE NOTE: All timestamps contained within this report are represented as [place] Standard Time. CONFIDENTIALTY NOTICE: This fax transmission is intended only for the addressee. It contains information that is legally privileged, confidential or otherwise protected from use or disclosure. If you are not the intended recipient, you are strictly prohibited from reviewing, disclosing, copying using or disseminating any of this information or taking any action in reliance on or regarding this information. If you have received this fax in error, please notify us immediately by telephone so that we can arrange for its return to [place]. Phone: [phone], Toll-Free: [phone], Fax: [phone] Page: 2 of 2 Call Id: [id number] [place]. Time Eilene Ghazi Time) Disposition Final User 04/22/2021 1:45:08 PM See HCP within 4 Hours (or PCP triage) Yes Ricard Dillon, RN, Towanda Octave Disagree/Comply Comply Caller Understands Yes PreDisposition [place] Advice Given Per Guideline SEE HCP (OR PCP TRIAGE) WITHIN 4 HOURS: * IF OFFICE WILL BE CLOSED AND PCP SECOND-LEVEL TRIAGE REQUIRED: You may need to be seen. Your doctor (or NP/PA) will want to talk with you to decide what's best. I'll page the oncall provider now. If you haven't heard from the provider (or me) within 30 minutes, call again. NOTE: If on-call provider can't be reached, send to [facility] or ED. USE A COLD PACK FOR PAIN, SWELLING, OR BRUISING: * Put a cold pack or an ice bag (wrapped in a moist towel) on the area for 20 minutes. * Repeat in 1 hour, then every 4 hours while awake. * Continue this for the first 48 hours (2 days) after an injury. * This will help decrease pain, swelling, and bruising. * Caution: Avoid frostbite. * NAPROXEN (E.G., ALEVE): Take 220 mg (one  220 mg pill) by mouth every 8 to 12 hours as needed. You may take 440 mg (two 220 mg pills) for your first dose. The most you  should take each day is 660 mg (three 220 mg pills a day), unless your doctor has told you to take more. REMOVE RINGS: * If possible, remove any rings or jewelry from the fingers of the injured hand. * Reason: Swelling from injury may cause ring to become trapped on finger, the ring then cuts off circulation to the finger. CALL BACK IF: * You become worse CARE ADVICE given per Hand and Wrist Injury (Adult) guideline. Referrals GO TO FACILITY OTHER - SPECIF

## 2021-05-16 ENCOUNTER — Ambulatory Visit: Payer: Medicare PPO | Admitting: Family Medicine

## 2021-05-19 NOTE — Patient Instructions (Addendum)
It was great to see you again today!  Assuming all is well, please see me in about 6 months Flu shot today We will see how your lipids look Please get the new covid booster and the shingles vaccine- shingrix- at your convenience

## 2021-05-19 NOTE — Progress Notes (Addendum)
[facility] [place], [place], [place] [address] [phone]  Date:  05/21/2021   Name:  Yvette [name]   DOB:  [birth year]   MRN:  [id number]  PCP:  Darreld Mclean, MD    Chief Complaint: 6 month follow up (Concerns/ questions: pt says she has a ruptured vein in the L hand that she would like looked at. /Flu shot today: yes if she if able (chills, diarrhea on Saturday)/)   History of Present Illness:  Yvette [name] is a 70 y.o. very pleasant female patient who presents with the following:  Patient seen today for periodic follow-up visit Most recently seen by myself in March-  History of hypertension, hyperlipidemia, GERD, obesity  She is a widow, her husband died 2 years ago She has 2 adult children She is living with her daughter [name] who is going through a divorce   She hit the back of her left hand on a corner about a month ago and developed a hematoma.  She went to the ER as it puffed up a lot- they thought it was ok It has gone down a lot but is still present Only tender if se presses right on it  She had a stomach bug on Saturday- diarrhea only, cramping but no vomiting She is better now; sx resolved The grandkids had minor diarrhea earlier in the week  Shingrix- recommend  Flu shot- give today  COVID booster- recommend  Labs done in [place] that time we noted dyslipidemia, however she is not able to tolerate statins I mentioned Repatha and Praluent but she did not get back to me as of yet She is using omega 3 and flax seed  For the time being she does not wish to try an injectable due to cost, does not wish to use fenofibrate, etc   The 10-year ASCVD risk score (Arnett DK, et al., 2019) is: 13.8%   Values used to calculate the score:     Age: 70 years     Sex: Female     Is Non-Hispanic African American: No     Diabetic: No     Tobacco smoker: No     Systolic Blood Pressure: 295 mmHg      Is BP treated: Yes     HDL Cholesterol: 49.1 mg/dL     Total Cholesterol: 258 mg/dL  Fosamax Amlodipine 10 Benazepril 20 Metoprolol 25 Patient Active Problem List   Diagnosis Date Noted   Osteoporosis 10/27/2020   Hyperlipidemia with target LDL less than 100 05/26/2015   Morbid obesity ([place]) 03/16/2015   Hypertension 02/17/2013   GERD (gastroesophageal reflux disease) 02/17/2013    Past Medical History:  Diagnosis Date   GERD (gastroesophageal reflux disease)    Hypertension     Past Surgical History:  Procedure Laterality Date   ABDOMINAL HYSTERECTOMY     total   BREAST BIOPSY Right    knee surgery Bilateral    TONSILLECTOMY      Social History   Tobacco Use   Smoking status: Never   Smokeless tobacco: Never  Substance Use Topics   Alcohol use: Yes   Drug use: No    Family History  Problem Relation Age of Onset   Alcohol abuse Mother    Cirrhosis Mother    Heart disease Father    Parkinson's disease Brother     No Known Allergies  Medication list has been reviewed and updated.  Current Outpatient Medications on File Prior to Visit  Medication Sig Dispense Refill   alendronate (FOSAMAX) 70 MG tablet Take 1 tablet (70 mg total) by mouth every 7 (seven) days. Take with a full glass of water on an empty stomach. 12 tablet 3   amLODipine (NORVASC) 10 MG tablet Take 1 tablet (10 mg total) by mouth daily. 90 tablet 3   benazepril (LOTENSIN) 20 MG tablet Take 1 tablet (20 mg total) by mouth 2 (two) times daily. 180 tablet 1   diclofenac sodium (VOLTAREN) 1 % GEL Apply 4 g topically 4 (four) times daily. 500 g 1   fluticasone (FLONASE) 50 MCG/ACT nasal spray Place 1 spray into both nostrils daily. 16 g 11   metoprolol succinate (TOPROL-XL) 25 MG 24 hr tablet Take 1 tablet (25 mg total) by mouth daily. 90 tablet 3   metroNIDAZOLE (METROGEL) 0.75 % gel Apply to face daily to as needed 45 g 2   omeprazole (PRILOSEC) 20 MG capsule Take 1 capsule (20 mg total) by  mouth daily. 90 capsule 3   No current facility-administered medications on file prior to visit.    Review of Systems:  As per HPI- otherwise negative.   Physical Examination: Vitals:   05/21/21 1413  BP: 128/70  Pulse: 66  Resp: 18  Temp: 97.8 F (36.6 C)  SpO2: 98%   Vitals:   05/21/21 1413  Weight: 222 lb 6.4 oz (100.9 kg)  Height: 5\' 3"  (1.6 m)   Body mass index is 39.4 kg/m. Ideal Body Weight: Weight in (lb) to have BMI = 25: 140.8  GEN: no acute distress.  Obese, looks well  HEENT: Atraumatic, Normocephalic.  Ears and Nose: No external deformity. CV: RRR, No M/G/R. No JVD. No thrill. No extra heart sounds. PULM: CTA B, no wheezes, crackles, rhonchi. No retractions. No resp. distress. No accessory muscle use. ABD: S, NT, ND No rebound. No HSM. EXTR: No c/c/e PSYCH: Normally interactive. Conversant.  Resolving hematoma on dorsum of left hand- appears to be healing ok   Assessment and Plan: Essential hypertension - Plan: Basic metabolic panel  Hyperlipidemia, unspecified hyperlipidemia type - Plan: Lipid panel  Need for influenza vaccination - Plan: Flu Vaccine QUAD High Dose(Fluad) BP under good control Check on lipids today- statin intolerant  She is using omega 3 and flax seed- check her progress Flu shot today Recommend covid booster and shingirx  Recheck in 6 months   Signed Lamar Blinks, MD 8/4-  Received her labs as below, message to pt Results for orders placed or performed in visit on 05/21/21  Lipid panel  Result Value Ref Range   Cholesterol 203 (H) 0 - 200 mg/dL   Triglycerides 163.0 (H) 0.0 - 149.0 mg/dL   HDL 48.90 >39.00 mg/dL   VLDL 32.6 0.0 - 40.0 mg/dL   LDL Cholesterol 122 (H) 0 - 99 mg/dL   Total CHOL/HDL Ratio 4    NonHDL 389.37   Basic metabolic panel  Result Value Ref Range   Sodium 140 135 - 145 mEq/L   Potassium 4.4 3.5 - 5.1 mEq/L   Chloride 105 96 - 112 mEq/L   CO2 24 19 - 32 mEq/L   Glucose, Bld 79 70 - 99 mg/dL    BUN 13 6 - 23 mg/dL   Creatinine, Ser 0.74 0.40 - 1.20 mg/dL   GFR 81.81 >60.00 mL/min   Calcium 9.5 8.4 - 10.5 mg/dL

## 2021-05-21 ENCOUNTER — Ambulatory Visit (INDEPENDENT_AMBULATORY_CARE_PROVIDER_SITE_OTHER): Payer: Medicare PPO | Admitting: Family Medicine

## 2021-05-21 ENCOUNTER — Other Ambulatory Visit: Payer: Self-pay

## 2021-05-21 VITALS — BP 128/70 | HR 66 | Temp 97.8°F | Resp 18 | Ht 63.0 in | Wt 222.4 lb

## 2021-05-21 DIAGNOSIS — I10 Essential (primary) hypertension: Secondary | ICD-10-CM | POA: Diagnosis not present

## 2021-05-21 DIAGNOSIS — T466X5A Adverse effect of antihyperlipidemic and antiarteriosclerotic drugs, initial encounter: Secondary | ICD-10-CM | POA: Diagnosis not present

## 2021-05-21 DIAGNOSIS — Z23 Encounter for immunization: Secondary | ICD-10-CM

## 2021-05-21 DIAGNOSIS — G720 Drug-induced myopathy: Secondary | ICD-10-CM

## 2021-05-21 DIAGNOSIS — E785 Hyperlipidemia, unspecified: Secondary | ICD-10-CM | POA: Diagnosis not present

## 2021-05-22 ENCOUNTER — Encounter: Payer: Self-pay | Admitting: Family Medicine

## 2021-05-22 LAB — BASIC METABOLIC PANEL
BUN: 13 mg/dL (ref 6–23)
CO2: 24 mEq/L (ref 19–32)
Calcium: 9.5 mg/dL (ref 8.4–10.5)
Chloride: 105 mEq/L (ref 96–112)
Creatinine, Ser: 0.74 mg/dL (ref 0.40–1.20)
GFR: 81.81 mL/min (ref >60.00)
Glucose, Bld: 79 mg/dL (ref 70–99)
Potassium: 4.4 mEq/L (ref 3.5–5.1)
Sodium: 140 mEq/L (ref 135–145)

## 2021-05-22 LAB — LIPID PANEL
Cholesterol: 203 mg/dL — ABNORMAL HIGH (ref 0–200)
HDL: 48.9 mg/dL (ref >39.00)
LDL Cholesterol: 122 mg/dL — ABNORMAL HIGH (ref 0–99)
NonHDL: 154.28
Total CHOL/HDL Ratio: 4
Triglycerides: 163 mg/dL — ABNORMAL HIGH (ref 0.0–149.0)
VLDL: 32.6 mg/dL (ref 0.0–40.0)

## 2021-07-06 NOTE — Progress Notes (Signed)
[facility] at [facility] [address] [place], [place] [address] [phone]  Date:  07/09/2021   Name:  Brandace Cargle   DOB:  28-Jan-1951   MRN:  [id number]  PCP:  Darreld Mclean, MD    Chief Complaint: No chief complaint on file.   History of Present Illness:  Yvette [name] is a 70 y.o. very pleasant female patient who presents with the following:  Virtual visit today for concern of illness Pt location is home my location is office Pt ID confirmed with 2 factors, she gives consent for a virtual visit today   Last seen by myself about 6 weeks ago for a routine visit  History of HTN, hyperlipdemia  Cough started about 5 weeks ago- she is now however feeling worse  She is coughing up some green material - had been just white sputum until the last week or so She tested negative for covid but did not test for flu   Her eyes were matted shut this am She does not wear contacts, vision is ok - eyes are not painful  She notes chills, but has not measured a fever No vomiting or diarrhea Congestion are both in her chest and her sinuses Covid test negative   No ST- she did have laryngitis for a week at the start of illness but it cleared up   As of now she is using OTC cough drops but no other meds   Patient Active Problem List   Diagnosis Date Noted   Osteoporosis 10/27/2020   Hyperlipidemia with target LDL less than 100 05/26/2015   Morbid obesity ([place]) 03/16/2015   Hypertension 02/17/2013   GERD (gastroesophageal reflux disease) 02/17/2013    Past Medical History:  Diagnosis Date   GERD (gastroesophageal reflux disease)    Hypertension     Past Surgical History:  Procedure Laterality Date   ABDOMINAL HYSTERECTOMY     total   BREAST BIOPSY Right    knee surgery Bilateral    TONSILLECTOMY      Social History   Tobacco Use   Smoking status: Never   Smokeless tobacco: Never  Substance Use Topics    Alcohol use: Yes   Drug use: No    Family History  Problem Relation Age of Onset   Alcohol abuse Mother    Cirrhosis Mother    Heart disease Father    Parkinson's disease Brother     No Known Allergies  Medication list has been reviewed and updated.  Current Outpatient Medications on File Prior to Visit  Medication Sig Dispense Refill   alendronate (FOSAMAX) 70 MG tablet Take 1 tablet (70 mg total) by mouth every 7 (seven) days. Take with a full glass of water on an empty stomach. 12 tablet 3   amLODipine (NORVASC) 10 MG tablet Take 1 tablet (10 mg total) by mouth daily. 90 tablet 3   benazepril (LOTENSIN) 20 MG tablet Take 1 tablet (20 mg total) by mouth 2 (two) times daily. 180 tablet 1   diclofenac sodium (VOLTAREN) 1 % GEL Apply 4 g topically 4 (four) times daily. 500 g 1   fluticasone (FLONASE) 50 MCG/ACT nasal spray Place 1 spray into both nostrils daily. 16 g 11   metoprolol succinate (TOPROL-XL) 25 MG 24 hr tablet Take 1 tablet (25 mg total) by mouth daily. 90 tablet 3   metroNIDAZOLE (METROGEL) 0.75 % gel Apply to face daily to as needed 45 g  2   omeprazole (PRILOSEC) 20 MG capsule Take 1 capsule (20 mg total) by mouth daily. 90 capsule 3   No current facility-administered medications on file prior to visit.    Review of Systems:  As per HPI- otherwise negative.   Physical Examination: There were no vitals filed for this visit. There were no vitals filed for this visit. There is no height or weight on file to calculate BMI. Ideal Body Weight:    Pt observed via video monitor She looks well and non toxic but is coughing quite a bit  She states she is checking her BP and pulse and they are normal  Assessment and Plan: Chronic cough - Plan: benzonatate (TESSALON PERLES) 100 MG capsule  Acute non-recurrent frontal sinusitis - Plan: cefdinir (OMNICEF) 300 MG capsule  Acute bacterial conjunctivitis of both eyes - Plan: trimethoprim-polymyxin b (POLYTRIM) ophthalmic  solution  Video used for visit today Pt notes a cough for 5 days but worse for 1 week- past treatment window for possible flu, she has tested - for covid Will cover with omnicef, polytrim for her eyes Tessalon as needed She will let me know if not doing better in the next couple of days- Sooner if worse.     Signed Lamar Blinks, MD

## 2021-07-09 ENCOUNTER — Other Ambulatory Visit: Payer: Self-pay

## 2021-07-09 ENCOUNTER — Telehealth (INDEPENDENT_AMBULATORY_CARE_PROVIDER_SITE_OTHER): Payer: Medicare PPO | Admitting: Family Medicine

## 2021-07-09 DIAGNOSIS — H1033 Unspecified acute conjunctivitis, bilateral: Secondary | ICD-10-CM | POA: Diagnosis not present

## 2021-07-09 DIAGNOSIS — J0110 Acute frontal sinusitis, unspecified: Secondary | ICD-10-CM

## 2021-07-09 DIAGNOSIS — R053 Chronic cough: Secondary | ICD-10-CM | POA: Diagnosis not present

## 2021-07-09 MED ORDER — POLYMYXIN B-TRIMETHOPRIM 10000-0.1 UNIT/ML-% OP SOLN: 1.0000 [drp] | OPHTHALMIC | 0 refills | Status: DC

## 2021-07-09 MED ORDER — BENZONATATE 100 MG PO CAPS: 100.0000 mg | ORAL_CAPSULE | Freq: Three times a day (TID) | ORAL | 1 refills | Status: DC | PRN

## 2021-07-09 MED ORDER — CEFDINIR 300 MG PO CAPS
300.0000 mg | ORAL_CAPSULE | Freq: Two times a day (BID) | ORAL | 0 refills | Status: DC
Start: 2021-07-09 — End: 2021-11-19

## 2021-09-26 ENCOUNTER — Other Ambulatory Visit: Payer: Self-pay | Admitting: Family Medicine

## 2021-09-26 DIAGNOSIS — M810 Age-related osteoporosis without current pathological fracture: Secondary | ICD-10-CM

## 2021-10-30 DIAGNOSIS — Z1231 Encounter for screening mammogram for malignant neoplasm of breast: Secondary | ICD-10-CM | POA: Diagnosis not present

## 2021-11-07 ENCOUNTER — Other Ambulatory Visit: Payer: Self-pay | Admitting: Family Medicine

## 2021-11-07 DIAGNOSIS — K219 Gastro-esophageal reflux disease without esophagitis: Secondary | ICD-10-CM

## 2021-11-08 ENCOUNTER — Other Ambulatory Visit: Payer: Self-pay | Admitting: Family Medicine

## 2021-11-08 DIAGNOSIS — I10 Essential (primary) hypertension: Secondary | ICD-10-CM

## 2021-11-10 NOTE — Progress Notes (Addendum)
[facility] ?[address], [address] ?[place], [place] [address] ?336 [phone] ?Fax [phone] ? ?Date:  11/19/2021  ? ?Name:  Yvette [name]   DOB:  01-Feb-1951   MRN:  [id number] ? ?PCP:  Darreld Mclean, MD  ? ? ?Chief Complaint: 6 month follow up (Concerns/ questions: Pt says since she had covid she has been cold a lot more. /) ? ? ?History of Present Illness: ? ?Yvette [name] is a 71 y.o. very pleasant female patient who presents with the following: ? ?Patient seen today for periodic follow-up ?Most recent visit with myself was in October-  History of hypertension, hyperlipidemia, GERD, obesity ?  ?She is a widow, her husband died a few years ago ?She has 2 adult children ?She is living with her daughter Yvette [name] who is going through a divorce  ? ?She had covid 2 months ago and did ok- she recovered fully with no further intervention ?She has not yet gotten the updated Bivalent COVID booster ?She has more aches and pains since she turned 70!  ? ?Back in August she picked up a pot and broke a blood vessel in the back of her left hand- the area is still a bit sore  ?She is having more bilateral wrist pain the last few months which she attributes to arthritis.  However, she also notes numbness in both of her index fingers which has been present for months or possibly a year.  No pain ? ?Shingrix- reminded her to get at her pharmacy at her convenience ?COVID-19 booster- not done yet.  Recommended that she do this summer since she has had COVID ?Mammogram can be updated- this was just done per pt report, done at [facility] on 3/14- normal  ?Bone density is up-to-date.  She is tolerating the fosamax well, started about a year ago ?She would like to update bone density next year  ?Some labs done in October-BMP, lipid-can update CMP, A1c, CBC, thyroid ?She is fasting today for labs  ? ?She enjoys gardening for exercise ?She is active with her grands who are doing a Paramedic  -school at home ?No CP or SOB ?No PMB  ?Patient Active Problem List  ? Diagnosis Date Noted  ? Osteoporosis 10/27/2020  ? Hyperlipidemia with target LDL less than 100 05/26/2015  ? Morbid obesity ([place]) 03/16/2015  ? Hypertension 02/17/2013  ? GERD (gastroesophageal reflux disease) 02/17/2013  ? ? ?Past Medical History:  ?Diagnosis Date  ? GERD (gastroesophageal reflux disease)   ? Hypertension   ? ? ?Past Surgical History:  ?Procedure Laterality Date  ? ABDOMINAL HYSTERECTOMY    ? total  ? BREAST BIOPSY Right   ? knee surgery Bilateral   ? TONSILLECTOMY    ? ? ?Social History  ? ?Tobacco Use  ? Smoking status: Never  ? Smokeless tobacco: Never  ?Substance Use Topics  ? Alcohol use: Yes  ? Drug use: No  ? ? ?Family History  ?Problem Relation Age of Onset  ? Alcohol abuse Mother   ? Cirrhosis Mother   ? Heart disease Father   ? Parkinson's disease Brother   ? ? ?No Known Allergies ? ?Medication list has been reviewed and updated. ? ?Current Outpatient Medications on File Prior to Visit  ?Medication Sig Dispense Refill  ? alendronate (FOSAMAX) 70 MG tablet TAKE 1 TABLET(70 MG) BY MOUTH EVERY 7 DAYS WITH A FULL GLASS OF WATER AND ON AN EMPTY STOMACH 12 tablet 3  ?  amLODipine (NORVASC) 10 MG tablet TAKE 1 TABLET(10 MG) BY MOUTH DAILY 90 tablet 1  ? benazepril (LOTENSIN) 20 MG tablet Take 1 tablet (20 mg total) by mouth 2 (two) times daily. 180 tablet 1  ? diclofenac sodium (VOLTAREN) 1 % GEL Apply 4 g topically 4 (four) times daily. 500 g 1  ? fluticasone (FLONASE) 50 MCG/ACT nasal spray Place 1 spray into both nostrils daily. 16 g 11  ? metoprolol succinate (TOPROL-XL) 25 MG 24 hr tablet TAKE 1 TABLET(25 MG) BY MOUTH DAILY 90 tablet 1  ? metroNIDAZOLE (METROGEL) 0.75 % gel Apply to face daily to as needed 45 g 2  ? omeprazole (PRILOSEC) 20 MG capsule TAKE 1 CAPSULE(20 MG) BY MOUTH DAILY 90 capsule 1  ? ?No current facility-administered medications on file prior to visit.  ? ? ?Review of Systems: ? ?As per HPI- otherwise  negative. ? ? ?Physical Examination: ?Vitals:  ? 11/19/21 0859  ?BP: 124/80  ?Pulse: 64  ?Resp: 18  ?Temp: 98.4 ?F (36.9 ?C)  ?SpO2: 98%  ? ?Vitals:  ? 11/19/21 0859  ?Weight: 222 lb 12.8 oz (101.1 kg)  ?Height: '5\' 3"'$  (1.6 m)  ? ?Body mass index is 39.47 kg/m?. ?Ideal Body Weight: Weight in (lb) to have BMI = 25: 140.8 ? ?GEN: no acute distress.  Obese, otherwise looks well ?HEENT: Atraumatic, Normocephalic.  Bilateral TM wnl, oropharynx normal.  PEERL,EOMI.   ?Ears and Nose: No external deformity. ?CV: RRR, No M/G/R. No JVD. No thrill. No extra heart sounds. ?PULM: CTA B, no wheezes, crackles, rhonchi. No retractions. No resp. distress. No accessory muscle use. ?ABD: S, NT, ND. No rebound. No HSM. ?EXTR: No c/c/e ?PSYCH: Normally interactive. Conversant.  ?Thickening of hand and wrist joints is apparent bilaterally consistent with osteoarthritis.  Circulation appears normal in both index fingers ? ?Wt Readings from Last 3 Encounters:  ?11/19/21 222 lb 12.8 oz (101.1 kg)  ?05/21/21 222 lb 6.4 oz (100.9 kg)  ?04/10/21 221 lb (100.2 kg)  ? ? ?Assessment and Plan: ?Essential hypertension - Plan: CBC, Comprehensive metabolic panel ? ?Hyperlipidemia, unspecified hyperlipidemia type ? ?Statin myopathy ? ?Screening for thyroid disorder - Plan: TSH ? ?Fatigue, unspecified type - Plan: TSH ? ?Screening for diabetes mellitus - Plan: Comprehensive metabolic panel, Hemoglobin A1c ? ?Numbness of fingers - Plan: Ambulatory referral to Neurology, DG Cervical Spine Complete, Ferritin, Vitamin B12, Folate ? ?Patient seen today for follow-up.  Blood pressures under good control, continue amlodipine and benazepril, metoprolol ?She is not taking a statin due to statin myopathy ?She notes more fatigue, will check thyroid level today ?Also, she notes numbness in her bilateral index fingers.  She endorses a history of neck problems, no recent films.  We will obtain cervical spine films.  We will check ferritin, B12 and folate, referral to  neurology for possible nerve conduction studies ?Signed ?Lamar Blinks, MD ? ?Received labs as below, message to patient ?Results for orders placed or performed in visit on 11/19/21  ?CBC  ?Result Value Ref Range  ? WBC 4.9 4.0 - 10.5 K/uL  ? RBC 4.07 3.87 - 5.11 Mil/uL  ? Platelets 299.0 150.0 - 400.0 K/uL  ? Hemoglobin 13.0 12.0 - 15.0 g/dL  ? HCT 38.4 36.0 - 46.0 %  ? MCV 94.4 78.0 - 100.0 fl  ? MCHC 34.0 30.0 - 36.0 g/dL  ? RDW 13.6 11.5 - 15.5 %  ?Comprehensive metabolic panel  ?Result Value Ref Range  ? Sodium 138 135 - 145 mEq/L  ?  Potassium 5.3 No hemolysis seen (H) 3.5 - 5.1 mEq/L  ? Chloride 104 96 - 112 mEq/L  ? CO2 26 19 - 32 mEq/L  ? Glucose, Bld 97 70 - 99 mg/dL  ? BUN 20 6 - 23 mg/dL  ? Creatinine, Ser 0.92 0.40 - 1.20 mg/dL  ? Total Bilirubin 0.4 0.2 - 1.2 mg/dL  ? Alkaline Phosphatase 47 39 - 117 U/L  ? AST 19 0 - 37 U/L  ? ALT 18 0 - 35 U/L  ? Total Protein 7.0 6.0 - 8.3 g/dL  ? Albumin 4.7 3.5 - 5.2 g/dL  ? GFR 62.78 >60.00 mL/min  ? Calcium 10.1 8.4 - 10.5 mg/dL  ?Hemoglobin A1c  ?Result Value Ref Range  ? Hgb A1c MFr Bld 5.7 4.6 - 6.5 %  ?TSH  ?Result Value Ref Range  ? TSH 3.29 0.35 - 5.50 uIU/mL  ?Ferritin  ?Result Value Ref Range  ? Ferritin 54.1 10.0 - 291.0 ng/mL  ?Vitamin B12  ?Result Value Ref Range  ? Vitamin B-12 501 211 - 911 pg/mL  ?Folate  ?Result Value Ref Range  ? Folate >24.2 >5.9 ng/mL  ? ? ? ?

## 2021-11-10 NOTE — Patient Instructions (Addendum)
It was good to see you again today, assuming all is well please see me in 6 months ?Please stop by the lab and then go to x-ray ?We will have you see neurology to check on the numbness in your index fingers ?Continue to work on regular exercise and work on [employer] control ? ?Please plan to get the newer shingles series and your convenience, and the covid bivalent booster in the next few months  ?

## 2021-11-19 ENCOUNTER — Ambulatory Visit (HOSPITAL_BASED_OUTPATIENT_CLINIC_OR_DEPARTMENT_OTHER)
Admission: RE | Admit: 2021-11-19 | Discharge: 2021-11-19 | Disposition: A | Discharge status: Home / Self Care | Payer: Medicare PPO | Source: Ambulatory Visit | Attending: Family Medicine | Admitting: Family Medicine

## 2021-11-19 ENCOUNTER — Encounter: Payer: Self-pay | Admitting: Family Medicine

## 2021-11-19 ENCOUNTER — Ambulatory Visit (INDEPENDENT_AMBULATORY_CARE_PROVIDER_SITE_OTHER): Payer: Medicare PPO | Admitting: Family Medicine

## 2021-11-19 VITALS — BP 124/80 | HR 64 | Temp 98.4°F | Resp 18 | Ht 63.0 in | Wt 222.8 lb

## 2021-11-19 DIAGNOSIS — G720 Drug-induced myopathy: Secondary | ICD-10-CM | POA: Diagnosis not present

## 2021-11-19 DIAGNOSIS — Z131 Encounter for screening for diabetes mellitus: Secondary | ICD-10-CM | POA: Diagnosis not present

## 2021-11-19 DIAGNOSIS — E785 Hyperlipidemia, unspecified: Secondary | ICD-10-CM

## 2021-11-19 DIAGNOSIS — T466X5A Adverse effect of antihyperlipidemic and antiarteriosclerotic drugs, initial encounter: Secondary | ICD-10-CM | POA: Diagnosis not present

## 2021-11-19 DIAGNOSIS — Z1329 Encounter for screening for other suspected endocrine disorder: Secondary | ICD-10-CM | POA: Diagnosis not present

## 2021-11-19 DIAGNOSIS — R5383 Other fatigue: Secondary | ICD-10-CM | POA: Diagnosis not present

## 2021-11-19 DIAGNOSIS — I10 Essential (primary) hypertension: Secondary | ICD-10-CM

## 2021-11-19 DIAGNOSIS — R200 Anesthesia of skin: Secondary | ICD-10-CM | POA: Insufficient documentation

## 2021-11-19 DIAGNOSIS — E875 Hyperkalemia: Secondary | ICD-10-CM

## 2021-11-19 DIAGNOSIS — Z23 Encounter for immunization: Secondary | ICD-10-CM

## 2021-11-19 LAB — COMPREHENSIVE METABOLIC PANEL
ALT: 18 U/L (ref 0–35)
AST: 19 U/L (ref 0–37)
Albumin: 4.7 g/dL (ref 3.5–5.2)
Alkaline Phosphatase: 47 U/L (ref 39–117)
BUN: 20 mg/dL (ref 6–23)
CO2: 26 mEq/L (ref 19–32)
Calcium: 10.1 mg/dL (ref 8.4–10.5)
Chloride: 104 mEq/L (ref 96–112)
Creatinine, Ser: 0.92 mg/dL (ref 0.40–1.20)
GFR: 62.78 mL/min (ref >60.00)
Glucose, Bld: 97 mg/dL (ref 70–99)
Potassium: 5.3 mEq/L — ABNORMAL HIGH (ref 3.5–5.1)
Sodium: 138 mEq/L (ref 135–145)
Total Bilirubin: 0.4 mg/dL (ref 0.2–1.2)
Total Protein: 7 g/dL (ref 6.0–8.3)

## 2021-11-19 LAB — HEMOGLOBIN A1C: Hgb A1c MFr Bld: 5.7 % (ref 4.6–6.5)

## 2021-11-19 LAB — CBC
HCT: 38.4 % (ref 36.0–46.0)
Hemoglobin: 13 g/dL (ref 12.0–15.0)
MCHC: 34 g/dL (ref 30.0–36.0)
MCV: 94.4 fl (ref 78.0–100.0)
Platelets: 299 10*3/uL (ref 150.0–400.0)
RBC: 4.07 Mil/uL (ref 3.87–5.11)
RDW: 13.6 % (ref 11.5–15.5)
WBC: 4.9 10*3/uL (ref 4.0–10.5)

## 2021-11-19 LAB — TSH: TSH: 3.29 u[IU]/mL (ref 0.35–5.50)

## 2021-11-19 LAB — VITAMIN B12: Vitamin B-12: 501 pg/mL (ref 211–911)

## 2021-11-19 LAB — FERRITIN: Ferritin: 54.1 ng/mL (ref 10.0–291.0)

## 2021-11-19 LAB — FOLATE: Folate: >24.2 ng/mL (ref >5.9)

## 2021-11-19 NOTE — Addendum Note (Signed)
Addended by: Lamar Blinks C on: 11/19/2021 08:23 PM ? ? Modules accepted: Orders ? ?

## 2021-11-20 ENCOUNTER — Other Ambulatory Visit: Payer: Self-pay | Admitting: Family Medicine

## 2021-11-20 ENCOUNTER — Encounter: Payer: Self-pay | Admitting: Family Medicine

## 2021-11-20 DIAGNOSIS — R200 Anesthesia of skin: Secondary | ICD-10-CM

## 2021-11-21 ENCOUNTER — Other Ambulatory Visit: Payer: Self-pay

## 2021-11-21 DIAGNOSIS — I10 Essential (primary) hypertension: Secondary | ICD-10-CM

## 2021-11-21 MED ORDER — BENAZEPRIL HCL 20 MG PO TABS: 20.0000 mg | ORAL_TABLET | Freq: Two times a day (BID) | ORAL | 1 refills | Status: DC

## 2022-01-02 DIAGNOSIS — G5603 Carpal tunnel syndrome, bilateral upper limbs: Secondary | ICD-10-CM | POA: Diagnosis not present

## 2022-01-02 DIAGNOSIS — G629 Polyneuropathy, unspecified: Secondary | ICD-10-CM | POA: Diagnosis not present

## 2022-01-07 ENCOUNTER — Encounter: Payer: Self-pay | Admitting: Family Medicine

## 2022-01-16 ENCOUNTER — Telehealth: Payer: Self-pay | Admitting: Family Medicine

## 2022-01-16 NOTE — Telephone Encounter (Signed)
Pt called stating she is experiencing swimmers ear and was wondering if anything could be prescribed to treat it.

## 2022-01-17 ENCOUNTER — Telehealth: Payer: TRICARE For Life (TFL) | Admitting: Family Medicine

## 2022-01-17 DIAGNOSIS — H9191 Unspecified hearing loss, right ear: Secondary | ICD-10-CM

## 2022-01-17 DIAGNOSIS — H60311 Diffuse otitis externa, right ear: Secondary | ICD-10-CM | POA: Diagnosis not present

## 2022-01-17 NOTE — Telephone Encounter (Signed)
Would you recommend that she be seen? Or are you willing to treat blindly?

## 2022-01-17 NOTE — Progress Notes (Signed)
Yvette [name]   Has hearing loss with ear pain- needs in person assessment for this. Message and detail sent.

## 2022-01-17 NOTE — Telephone Encounter (Signed)
Pt says she was seen at [facility] for her ear and it was infected. She will f/u here if needed.

## 2022-03-13 DIAGNOSIS — H6991 Unspecified Eustachian tube disorder, right ear: Secondary | ICD-10-CM | POA: Insufficient documentation

## 2022-03-13 DIAGNOSIS — H6981 Other specified disorders of Eustachian tube, right ear: Secondary | ICD-10-CM | POA: Diagnosis not present

## 2022-04-15 ENCOUNTER — Ambulatory Visit: Payer: Medicare PPO

## 2022-04-16 ENCOUNTER — Ambulatory Visit (INDEPENDENT_AMBULATORY_CARE_PROVIDER_SITE_OTHER): Payer: Medicare PPO

## 2022-04-16 VITALS — Ht 63.0 in | Wt 222.0 lb

## 2022-04-16 DIAGNOSIS — Z0000 Encounter for general adult medical examination without abnormal findings: Secondary | ICD-10-CM

## 2022-04-16 NOTE — Progress Notes (Signed)
Subjective:   Yvette [name] [name] is a 71 y.o. female who presents for Medicare Annual (Subsequent) preventive examination.  Review of Systems    No ROS.  Medicare Wellness Virtual Visit.  Visual/audio telehealth visit, UTA vital signs.   See social history for additional risk factors.   Cardiac Risk Factors include: advanced age (>44mn, >>15women)     Objective:    Today's Vitals   04/16/22 1307  Weight: 222 lb (100.7 kg)  Height: '5\' 3"'$  (1.6 m)   Body mass index is 39.33 kg/m.     04/16/2022    1:23 PM 04/10/2021    1:05 PM 09/26/2014    4:54 PM  Advanced Directives  Does Patient Have a Medical Advance Directive? No No No  Does patient want to make changes to medical advance directive?   Yes - information given  Would patient like information on creating a medical advance directive? No - Patient declined Yes (MAU/Ambulatory/Procedural Areas - Information given)     Current Medications (verified) Outpatient Encounter Medications as of 04/16/2022  Medication Sig   alendronate (FOSAMAX) 70 MG tablet TAKE 1 TABLET(70 MG) BY MOUTH EVERY 7 DAYS WITH A FULL GLASS OF WATER AND ON AN EMPTY STOMACH   amLODipine (NORVASC) 10 MG tablet TAKE 1 TABLET(10 MG) BY MOUTH DAILY   benazepril (LOTENSIN) 20 MG tablet Take 1 tablet (20 mg total) by mouth 2 (two) times daily.   diclofenac sodium (VOLTAREN) 1 % GEL Apply 4 g topically 4 (four) times daily.   fluticasone (FLONASE) 50 MCG/ACT nasal spray Place 1 spray into both nostrils daily.   metoprolol succinate (TOPROL-XL) 25 MG 24 hr tablet TAKE 1 TABLET(25 MG) BY MOUTH DAILY   metroNIDAZOLE (METROGEL) 0.75 % gel Apply to face daily to as needed   omeprazole (PRILOSEC) 20 MG capsule TAKE 1 CAPSULE(20 MG) BY MOUTH DAILY   No facility-administered encounter medications on file as of 04/16/2022.    Allergies (verified) Patient has no known allergies.   History: Past Medical History:  Diagnosis Date   GERD (gastroesophageal reflux disease)     Hypertension    Past Surgical History:  Procedure Laterality Date   ABDOMINAL HYSTERECTOMY     total   BREAST BIOPSY Right    knee surgery Bilateral    TONSILLECTOMY     Family History  Problem Relation Age of Onset   Alcohol abuse Mother    Cirrhosis Mother    Heart disease Father    Parkinson's disease Brother    Social History   Socioeconomic History   Marital status: Divorced    Spouse name: Not on file   Number of children: Not on file   Years of education: Not on file   Highest education level: Not on file  Occupational History   Not on file  Tobacco Use   Smoking status: Never   Smokeless tobacco: Never  Substance and Sexual Activity   Alcohol use: Yes    Comment: once every 2 months-cocktail at night or wine   Drug use: No   Sexual activity: Not on file  Other Topics Concern   Not on file  Social History Narrative   Not on file   Social Determinants of Health   Financial Resource Strain: Low Risk  (04/10/2021)   Overall Financial Resource Strain (CARDIA)    Difficulty of Paying Living Expenses: Not hard at all  Food Insecurity: No Food Insecurity (04/16/2022)   Hunger Vital Sign    Worried About Running  Out of Food in the Last Year: Never true    Ran Out of Food in the Last Year: Never true  Transportation Needs: No Transportation Needs (04/16/2022)   PRAPARE - Hydrologist (Medical): No    Lack of Transportation (Non-Medical): No  Physical Activity: Inactive (04/10/2021)   Exercise Vital Sign    Days of Exercise per Week: 0 days    Minutes of Exercise per Session: 0 min  Stress: No Stress Concern Present (04/16/2022)   [place]    Feeling of Stress : Not at all  Social Connections: Unknown (04/16/2022)   Social Connection and Isolation Panel [NHANES]    Frequency of Communication with Friends and Family: More than three times a week    Frequency of Social  Gatherings with Friends and Family: More than three times a week    Attends Religious Services: Not on Advertising copywriter or Organizations: Yes    Attends Music therapist: More than 4 times per year    Marital Status: Not on file    Tobacco Counseling Counseling given: Not Answered   Clinical Intake:  Pre-visit preparation completed: Yes        Diabetes: No  How often do you need to have someone help you when you read instructions, pamphlets, or other written materials from your doctor or pharmacy?: 1 - Never  Interpreter Needed?: No      Activities of Daily Living    04/16/2022    1:18 PM  In your present state of health, do you have any difficulty performing the following activities:  Hearing? 0  Comment Followed by [facility]  Vision? 0  Difficulty concentrating or making decisions? 0  Walking or climbing stairs? 0  Comment Paces self with activity due to R ankle pain. Declines follow up appointment at this time.  Dressing or bathing? 0  Doing errands, shopping? 0  Preparing Food and eating ? N  Using the Toilet? N  In the past six months, have you accidently leaked urine? Y  Comment Frequent at times.  Do you have problems with loss of bowel control? N  Managing your Medications? N  Managing your Finances? N  Housekeeping or managing your Housekeeping? N    Patient Care Team: Copland, Gay Filler, MD as PCP - General (Family Medicine) Lavonna Monarch, MD as Consulting Physician (Dermatology)  Indicate any recent Medical Services you may have received from other than [facility] providers in the past year (date may be approximate).     Assessment:   This is a routine wellness examination for Yvette [name].  Virtual Visit via Telephone Note  I connected with  Yvette [name] [name] on 04/16/22 at  1:00 PM EDT by telephone and verified that I am speaking with the correct person using two identifiers.  Location: Patient: home Provider:  office Persons participating in the virtual visit: patient/Nurse Health Advisor   I discussed the limitations of performing an evaluation and management service by telehealth. We continued and completed visit with audio only. Some vital signs may be absent or patient reported.   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  Audiologist visit about 3 weeks ago for outer ear infection.  Followed by [facility].  Vision Screening - Comments:: Followed by Dr.Weaver  Wears glasses  Dietary issues and exercise activities discussed: Current Exercise Habits: Home exercise  routine, Intensity: Mild Healthy diet Good water intake   Goals Addressed             This Visit's Progress    Reduce refined sugar intake       Eat healthier, drink more water & increase activity Weight goal 180lb       Depression Screen    04/16/2022    1:17 PM 11/19/2021    9:02 AM 04/10/2021    1:11 PM 11/29/2019   10:57 AM 11/24/2015    9:51 AM 05/26/2015    9:04 AM 09/26/2014    4:53 PM  PHQ 2/9 Scores  PHQ - 2 Score 0 0 0 0 1 0 0    Fall Risk    04/16/2022    1:16 PM 11/19/2021    9:02 AM 04/10/2021    1:10 PM 11/29/2019   10:56 AM 11/24/2015    9:51 AM  Fall Risk   Falls in the past year? 0 0 1 0 Yes  Number falls in past yr: 0 0 1  1  Injury with Fall? 0 0 0  No  Risk for fall due to :   History of fall(s)    Follow up Falls evaluation completed;Falls prevention discussed  Falls prevention discussed      FALL RISK PREVENTION PERTAINING TO THE HOME: Home free of loose throw rugs in walkways, pet beds, electrical cords, etc? Yes  Adequate lighting in your home to reduce risk of falls? Yes   ASSISTIVE DEVICES UTILIZED TO PREVENT FALLS: Life alert? No  Use of a cane, walker or w/c? No  Ankle brace? Yes Grab bars in the bathroom? No  Shower chair or bench in shower? No  Elevated toilet seat or a handicapped toilet? No   TIMED UP AND GO: Was  the test performed? No .   Cognitive Function:        04/16/2022    1:34 PM  6CIT Screen  What Year? 0 points  What month? 0 points  What time? 0 points  Count back from 20 0 points  Months in reverse 0 points  Repeat phrase 0 points  Total Score 0 points    Immunizations Immunization History  Administered Date(s) Administered   Fluad Quad(high Dose 65+) 05/21/2021   Influenza, High Dose Seasonal PF 05/31/2016   Influenza,inj,Quad PF,6+ Mos 05/26/2015   Influenza-Unspecified 05/31/2020   PFIZER(Purple Top)SARS-COV-2 Vaccination 10/15/2019, 11/16/2019, 05/31/2020, 01/04/2021   Pneumococcal Conjugate-13 11/24/2015   Pneumococcal Polysaccharide-23 11/08/2020   Tdap 02/17/2013   Zoster, Live 03/03/2015   Covid-19 vaccine status: Completed vaccines x4.  Shingrix Completed?: No.    Education has been provided regarding the importance of this vaccine. Patient has been advised to call insurance company to determine out of pocket expense if they have not yet received this vaccine. Advised may also receive vaccine at local pharmacy or Health Dept. Verbalized acceptance and understanding.  Screening Tests Health Maintenance  Topic Date Due   INFLUENZA VACCINE  03/19/2022   COVID-19 Vaccine (5 - Pfizer risk series) 05/02/2022 (Originally 03/01/2021)   Zoster Vaccines- Shingrix (1 of 2) 05/19/2022 (Originally 09/04/1969)   TETANUS/TDAP  02/18/2023   MAMMOGRAM  10/31/2023   COLONOSCOPY (Pts 45-65yr Insurance coverage will need to be confirmed)  03/20/2027   Pneumonia Vaccine 71 Years old  Completed   DEXA SCAN  Completed   Hepatitis C Screening  Completed   HPV VACCINES  Aged Out   Health Maintenance Health Maintenance Due  Topic Date Due  INFLUENZA VACCINE  03/19/2022   Lung Cancer Screening: (Low Dose CT Chest recommended if Age 46-80 years, 30 pack-year currently smoking OR have quit w/in 15years.) does not qualify.   Vision Screening: Recommended annual ophthalmology  exams for early detection of glaucoma and other disorders of the eye.  Dental Screening: Recommended annual dental exams for proper oral hygiene  Community Resource Referral / Chronic Care Management: CRR required this visit?  No   CCM required this visit?  No      Plan:    I have personally reviewed and noted the following in the patient's chart:   Medical and social history Use of alcohol, tobacco or illicit drugs  Current medications and supplements including opioid prescriptions. Patient is not currently taking opioid prescriptions. Functional ability and status Nutritional status Physical activity Advanced directives List of other physicians Hospitalizations, surgeries, and ER visits in previous 12 months Vitals Screenings to include cognitive, depression, and falls Referrals and appointments  In addition, I have reviewed and discussed with patient certain preventive protocols, quality metrics, and best practice recommendations. A written personalized care plan for preventive services as well as general preventive health recommendations were provided to patient.     Varney Biles, LPN   0/10/4915

## 2022-04-16 NOTE — Patient Instructions (Addendum)
Yvette [name] , Thank you for taking time to come for your Medicare Wellness Visit. I appreciate your ongoing commitment to your health goals. Please review the following plan we discussed and let me know if I can assist you in the future.   These are the goals we discussed:  Goals      Reduce refined sugar intake     Eat healthier, drink more water & increase activity Weight goal 180lb        This is a list of the screening recommended for you and due dates:  Health Maintenance  Topic Date Due   Flu Shot  03/19/2022   COVID-19 Vaccine (5 - Pfizer risk series) 05/02/2022*   Zoster (Shingles) Vaccine (1 of 2) 05/19/2022*   Tetanus Vaccine  02/18/2023   Mammogram  10/31/2023   Colon Cancer Screening  03/20/2027   Pneumonia Vaccine  Completed   DEXA scan (bone density measurement)  Completed   Hepatitis C Screening: USPSTF Recommendation to screen - Ages 35-79 yo.  Completed   HPV Vaccine  Aged Out  *Topic was postponed. The date shown is not the original due date.    Preventive Care 71 Years and Older, Female Preventive care refers to lifestyle choices and visits with your health care provider that can promote health and wellness. What does preventive care include? A yearly physical exam. This is also called an annual well check. Dental exams once or twice a year. Routine eye exams. Ask your health care provider how often you should have your eyes checked. Personal lifestyle choices, including: Daily care of your teeth and gums. Regular physical activity. Eating a healthy diet. Avoiding tobacco and drug use. Limiting alcohol use. Practicing safe sex. Taking low-dose aspirin every day. Taking vitamin and mineral supplements as recommended by your health care provider. What happens during an annual well check? The services and screenings done by your health care provider during your annual well check will depend on your age, overall health, lifestyle risk factors, and family  history of disease. Counseling  Your health care provider may ask you questions about your: Alcohol use. Tobacco use. Drug use. Emotional well-being. Home and relationship well-being. Sexual activity. Eating habits. History of falls. Memory and ability to understand (cognition). Work and work Statistician. Reproductive health. Screening  You may have the following tests or measurements: Height, weight, and BMI. Blood pressure. Lipid and cholesterol levels. These may be checked every 5 years, or more frequently if you are over 71 years old. Skin check. Lung cancer screening. You may have this screening every year starting at age 71 if you have a 30-pack-year history of smoking and currently smoke or have quit within the past 15 years. Fecal occult blood test (FOBT) of the stool. You may have this test every year starting at age 71. Flexible sigmoidoscopy or colonoscopy. You may have a sigmoidoscopy every 5 years or a colonoscopy every 10 years starting at age 71. Hepatitis C blood test. Hepatitis B blood test. Sexually transmitted disease (STD) testing. Diabetes screening. This is done by checking your blood sugar (glucose) after you have not eaten for a while (fasting). You may have this done every 1-3 years. Bone density scan. This is done to screen for osteoporosis. You may have this done starting at age 71. Mammogram. This may be done every 1-2 years. Talk to your health care provider about how often you should have regular mammograms. Talk with your health care provider about your test results, treatment options,  and if necessary, the need for more tests. Vaccines  Your health care provider may recommend certain vaccines, such as: Influenza vaccine. This is recommended every year. Tetanus, diphtheria, and acellular pertussis (Tdap, Td) vaccine. You may need a Td booster every 10 years. Zoster vaccine. You may need this after age 60. Pneumococcal 13-valent conjugate (PCV13)  vaccine. One dose is recommended after age 71. Pneumococcal polysaccharide (PPSV23) vaccine. One dose is recommended after age 71. Talk to your health care provider about which screenings and vaccines you need and how often you need them. This information is not intended to replace advice given to you by your health care provider. Make sure you discuss any questions you have with your health care provider. Document Released: 09/01/2015 Document Revised: 04/24/2016 Document Reviewed: 06/06/2015 Elsevier Interactive Patient Education  2017 [place] Prevention in the Home Falls can cause injuries. They can happen to people of all ages. There are many things you can do to make your home safe and to help prevent falls. What can I do on the outside of my home? Regularly fix the edges of walkways and driveways and fix any cracks. Remove anything that might make you trip as you walk through a door, such as a raised step or threshold. Trim any bushes or trees on the path to your home. Use bright outdoor lighting. Clear any walking paths of anything that might make someone trip, such as rocks or tools. Regularly check to see if handrails are loose or broken. Make sure that both sides of any steps have handrails. Any raised decks and porches should have guardrails on the edges. Have any leaves, snow, or ice cleared regularly. Use sand or salt on walking paths during winter. Clean up any spills in your garage right away. This includes oil or grease spills. What can I do in the bathroom? Use night lights. Install grab bars by the toilet and in the tub and shower. Do not use towel bars as grab bars. Use non-skid mats or decals in the tub or shower. If you need to sit down in the shower, use a plastic, non-slip stool. Keep the floor dry. Clean up any water that spills on the floor as soon as it happens. Remove soap buildup in the tub or shower regularly. Attach bath mats securely with  double-sided non-slip rug tape. Do not have throw rugs and other things on the floor that can make you trip. What can I do in the bedroom? Use night lights. Make sure that you have a light by your bed that is easy to reach. Do not use any sheets or blankets that are too big for your bed. They should not hang down onto the floor. Have a firm chair that has side arms. You can use this for support while you get dressed. Do not have throw rugs and other things on the floor that can make you trip. What can I do in the kitchen? Clean up any spills right away. Avoid walking on wet floors. Keep items that you use a lot in easy-to-reach places. If you need to reach something above you, use a strong step stool that has a grab bar. Keep electrical cords out of the way. Do not use floor polish or wax that makes floors slippery. If you must use wax, use non-skid floor wax. Do not have throw rugs and other things on the floor that can make you trip. What can I do with my stairs? Do not leave  any items on the stairs. Make sure that there are handrails on both sides of the stairs and use them. Fix handrails that are broken or loose. Make sure that handrails are as long as the stairways. Check any carpeting to make sure that it is firmly attached to the stairs. Fix any carpet that is loose or worn. Avoid having throw rugs at the top or bottom of the stairs. If you do have throw rugs, attach them to the floor with carpet tape. Make sure that you have a light switch at the top of the stairs and the bottom of the stairs. If you do not have them, ask someone to add them for you. What else can I do to help prevent falls? Wear shoes that: Do not have high heels. Have rubber bottoms. Are comfortable and fit you well. Are closed at the toe. Do not wear sandals. If you use a stepladder: Make sure that it is fully opened. Do not climb a closed stepladder. Make sure that both sides of the stepladder are locked  into place. Ask someone to hold it for you, if possible. Clearly mark and make sure that you can see: Any grab bars or handrails. First and last steps. Where the edge of each step is. Use tools that help you move around (mobility aids) if they are needed. These include: Canes. Walkers. Scooters. Crutches. Turn on the lights when you go into a dark area. Replace any light bulbs as soon as they burn out. Set up your furniture so you have a clear path. Avoid moving your furniture around. If any of your floors are uneven, fix them. If there are any pets around you, be aware of where they are. Review your medicines with your doctor. Some medicines can make you feel dizzy. This can increase your chance of falling. Ask your doctor what other things that you can do to help prevent falls. This information is not intended to replace advice given to you by your health care provider. Make sure you discuss any questions you have with your health care provider. Document Released: 06/01/2009 Document Revised: 01/11/2016 Document Reviewed: 09/09/2014 Elsevier Interactive Patient Education  2017 [employer].

## 2022-05-12 ENCOUNTER — Other Ambulatory Visit: Payer: Self-pay | Admitting: Family Medicine

## 2022-05-12 DIAGNOSIS — I10 Essential (primary) hypertension: Secondary | ICD-10-CM

## 2022-05-12 DIAGNOSIS — K219 Gastro-esophageal reflux disease without esophagitis: Secondary | ICD-10-CM

## 2022-05-19 NOTE — Progress Notes (Deleted)
[facility] [address], [place], [place] [address] [phone]  Date:  05/23/2022   Name:  Jerelyn Trimarco   DOB:  1950/12/18   MRN:  [id number]  PCP:  Darreld Mclean, MD    Chief Complaint: No chief complaint on file.   History of Present Illness:  Estrella Alcaraz is a 71 y.o. very pleasant female patient who presents with the following:  Tura is seen today for follow-up visit Most recently seen by myself in April of this year History of hypertension, hyperlipidemia, GERD, obesity She is a widow, 2 adult children At her last visit she was living with her daughter [name] who was going through her divorce She had COVID earlier this year, did okay  She enjoys gardening and taking care of her grandchildren  Recommend COVID booster Flu shot  Fosamax Amlodipine 10 Benazepril Toprol-XL Update lab work today Mammogram appears to be due Bone density last year Colonoscopy done 2018  Lab work in [employer], ferritin/folate/B12 all normal, blood counts, A1c 5.7, TSH normal Patient Active Problem List   Diagnosis Date Noted   Osteoporosis 10/27/2020   Hyperlipidemia with target LDL less than 100 05/26/2015   Morbid obesity ([place]) 03/16/2015   Hypertension 02/17/2013   GERD (gastroesophageal reflux disease) 02/17/2013    Past Medical History:  Diagnosis Date   GERD (gastroesophageal reflux disease)    Hypertension     Past Surgical History:  Procedure Laterality Date   ABDOMINAL HYSTERECTOMY     total   BREAST BIOPSY Right    knee surgery Bilateral    TONSILLECTOMY      Social History   Tobacco Use   Smoking status: Never   Smokeless tobacco: Never  Substance Use Topics   Alcohol use: Yes    Comment: once every 2 months-cocktail at night or wine   Drug use: No    Family History  Problem Relation Age of Onset   Alcohol abuse Mother    Cirrhosis Mother    Heart disease Father     Parkinson's disease Brother     No Known Allergies  Medication list has been reviewed and updated.  Current Outpatient Medications on File Prior to Visit  Medication Sig Dispense Refill   alendronate (FOSAMAX) 70 MG tablet TAKE 1 TABLET(70 MG) BY MOUTH EVERY 7 DAYS WITH A FULL GLASS OF WATER AND ON AN EMPTY STOMACH 12 tablet 3   amLODipine (NORVASC) 10 MG tablet TAKE 1 TABLET(10 MG) BY MOUTH DAILY 90 tablet 1   benazepril (LOTENSIN) 20 MG tablet TAKE 1 TABLET(20 MG) BY MOUTH TWICE DAILY 180 tablet 1   diclofenac sodium (VOLTAREN) 1 % GEL Apply 4 g topically 4 (four) times daily. 500 g 1   fluticasone (FLONASE) 50 MCG/ACT nasal spray Place 1 spray into both nostrils daily. 16 g 11   metoprolol succinate (TOPROL-XL) 25 MG 24 hr tablet TAKE 1 TABLET(25 MG) BY MOUTH DAILY 90 tablet 1   metroNIDAZOLE (METROGEL) 0.75 % gel Apply to face daily to as needed 45 g 2   omeprazole (PRILOSEC) 20 MG capsule TAKE 1 CAPSULE(20 MG) BY MOUTH DAILY 90 capsule 1   No current facility-administered medications on file prior to visit.    Review of Systems:  As per HPI- otherwise negative.   Physical Examination: There were no vitals filed for this visit. There were no vitals filed for this visit. There is no height or weight on file  to calculate BMI. Ideal Body Weight:    GEN: no acute distress. HEENT: Atraumatic, Normocephalic.  Ears and Nose: No external deformity. CV: RRR, No M/G/R. No JVD. No thrill. No extra heart sounds. PULM: CTA B, no wheezes, crackles, rhonchi. No retractions. No resp. distress. No accessory muscle use. ABD: S, NT, ND, +BS. No rebound. No HSM. EXTR: No c/c/e PSYCH: Normally interactive. Conversant.    Assessment and Plan: ***  Signed Lamar Blinks, MD

## 2022-05-21 NOTE — Progress Notes (Unsigned)
[place] at [facility] [address] [place], [place] [address] [phone]  Date:  05/23/2022   Name:  Yvette [name]   DOB:  May 25, 1951   MRN:  [id number]  PCP:  Darreld Mclean, MD    Chief Complaint: No chief complaint on file.   History of Present Illness:  Yvette [name] is a 71 y.o. very pleasant female patient who presents with the following:  Patient seen today for 72-monthfollow-up Most recent visit with myself was in April of this year History of hypertension, hyperlipidemia, GERD, obesity   She is a widow, her husband died a few years ago She has 2 adult children She is living with her daughter Yvette [place]who is going through a divorce   Shingrix Recommend COVID booster Flu shot Mammogram March 2022 Bone density up-to-date Colonoscopy 2018 Most recent lab work [place] ferritin/folate/B12 all normal/CBC/A1c 5.7/TSH Can update BMP and lipids today  Fosamax Amlodipine 10 Benazepril 20 twice daily Metoprolol 25 daily Patient Active Problem List   Diagnosis Date Noted   Osteoporosis 10/27/2020   Hyperlipidemia with target LDL less than 100 05/26/2015   Morbid obesity ([place] 03/16/2015   Hypertension 02/17/2013   GERD (gastroesophageal reflux disease) 02/17/2013    Past Medical History:  Diagnosis Date   GERD (gastroesophageal reflux disease)    Hypertension     Past Surgical History:  Procedure Laterality Date   ABDOMINAL HYSTERECTOMY     total   BREAST BIOPSY Right    knee surgery Bilateral    TONSILLECTOMY      Social History   Tobacco Use   Smoking status: Never   Smokeless tobacco: Never  Substance Use Topics   Alcohol use: Yes    Comment: once every 2 months-cocktail at night or wine   Drug use: No    Family History  Problem Relation Age of Onset   Alcohol abuse Mother    Cirrhosis Mother    Heart disease Father    Parkinson's disease Brother     No Known  Allergies  Medication list has been reviewed and updated.  Current Outpatient Medications on File Prior to Visit  Medication Sig Dispense Refill   alendronate (FOSAMAX) 70 MG tablet TAKE 1 TABLET(70 MG) BY MOUTH EVERY 7 DAYS WITH A FULL GLASS OF WATER AND ON AN EMPTY STOMACH 12 tablet 3   amLODipine (NORVASC) 10 MG tablet TAKE 1 TABLET(10 MG) BY MOUTH DAILY 90 tablet 1   benazepril (LOTENSIN) 20 MG tablet TAKE 1 TABLET(20 MG) BY MOUTH TWICE DAILY 180 tablet 1   diclofenac sodium (VOLTAREN) 1 % GEL Apply 4 g topically 4 (four) times daily. 500 g 1   fluticasone (FLONASE) 50 MCG/ACT nasal spray Place 1 spray into both nostrils daily. 16 g 11   metoprolol succinate (TOPROL-XL) 25 MG 24 hr tablet TAKE 1 TABLET(25 MG) BY MOUTH DAILY 90 tablet 1   metroNIDAZOLE (METROGEL) 0.75 % gel Apply to face daily to as needed 45 g 2   omeprazole (PRILOSEC) 20 MG capsule TAKE 1 CAPSULE(20 MG) BY MOUTH DAILY 90 capsule 1   No current facility-administered medications on file prior to visit.    Review of Systems:  As per HPI- otherwise negative.   Physical Examination: There were no vitals filed for this visit. There were no vitals filed for this visit. There is no height or weight on file to calculate BMI. Ideal Body Weight:    GEN:  no acute distress. HEENT: Atraumatic, Normocephalic.  Ears and Nose: No external deformity. CV: RRR, No M/G/R. No JVD. No thrill. No extra heart sounds. PULM: CTA B, no wheezes, crackles, rhonchi. No retractions. No resp. distress. No accessory muscle use. ABD: S, NT, ND, +BS. No rebound. No HSM. EXTR: No c/c/e PSYCH: Normally interactive. Conversant.    Assessment and Plan: ***  Signed Lamar Blinks, MD

## 2022-05-21 NOTE — Patient Instructions (Incomplete)
It was good to see you again today, I will be in touch with your labs as soon as possible Assuming all is well please see me in about 6 months I would recommend getting the newest COVID booster and RSV vaccine this fall  We will set you up with ortho in [place] to look at your foot pain   We will check for an iron deficiency which might explain the ridges in your nails

## 2022-05-23 ENCOUNTER — Encounter: Payer: Self-pay | Admitting: Family Medicine

## 2022-05-23 ENCOUNTER — Ambulatory Visit (INDEPENDENT_AMBULATORY_CARE_PROVIDER_SITE_OTHER): Payer: Medicare PPO | Admitting: Family Medicine

## 2022-05-23 ENCOUNTER — Ambulatory Visit: Payer: TRICARE For Life (TFL) | Admitting: Family Medicine

## 2022-05-23 VITALS — BP 124/72 | HR 62 | Temp 97.7°F | Resp 18 | Ht 63.0 in | Wt 218.6 lb

## 2022-05-23 DIAGNOSIS — E611 Iron deficiency: Secondary | ICD-10-CM

## 2022-05-23 DIAGNOSIS — Z131 Encounter for screening for diabetes mellitus: Secondary | ICD-10-CM

## 2022-05-23 DIAGNOSIS — M79671 Pain in right foot: Secondary | ICD-10-CM

## 2022-05-23 DIAGNOSIS — I10 Essential (primary) hypertension: Secondary | ICD-10-CM

## 2022-05-23 DIAGNOSIS — G720 Drug-induced myopathy: Secondary | ICD-10-CM | POA: Diagnosis not present

## 2022-05-23 DIAGNOSIS — E785 Hyperlipidemia, unspecified: Secondary | ICD-10-CM

## 2022-05-23 DIAGNOSIS — T466X5A Adverse effect of antihyperlipidemic and antiarteriosclerotic drugs, initial encounter: Secondary | ICD-10-CM

## 2022-05-23 LAB — BASIC METABOLIC PANEL
BUN: 23 mg/dL (ref 6–23)
CO2: 25 mEq/L (ref 19–32)
Calcium: 10.1 mg/dL (ref 8.4–10.5)
Chloride: 104 mEq/L (ref 96–112)
Creatinine, Ser: 0.9 mg/dL (ref 0.40–1.20)
GFR: 64.23 mL/min (ref >60.00)
Glucose, Bld: 90 mg/dL (ref 70–99)
Potassium: 4.8 mEq/L (ref 3.5–5.1)
Sodium: 138 mEq/L (ref 135–145)

## 2022-05-23 LAB — LIPID PANEL
Cholesterol: 216 mg/dL — ABNORMAL HIGH (ref 0–200)
HDL: 46.3 mg/dL (ref >39.00)
LDL Cholesterol: 134 mg/dL — ABNORMAL HIGH (ref 0–99)
NonHDL: 169.68
Total CHOL/HDL Ratio: 5
Triglycerides: 176 mg/dL — ABNORMAL HIGH (ref 0.0–149.0)
VLDL: 35.2 mg/dL (ref 0.0–40.0)

## 2022-05-23 LAB — CBC
HCT: 37 % (ref 36.0–46.0)
Hemoglobin: 12.4 g/dL (ref 12.0–15.0)
MCHC: 33.5 g/dL (ref 30.0–36.0)
MCV: 93.5 fl (ref 78.0–100.0)
Platelets: 287 10*3/uL (ref 150.0–400.0)
RBC: 3.95 Mil/uL (ref 3.87–5.11)
RDW: 13.7 % (ref 11.5–15.5)
WBC: 5.7 10*3/uL (ref 4.0–10.5)

## 2022-05-23 LAB — FERRITIN: Ferritin: 45.8 ng/mL (ref 10.0–291.0)

## 2022-06-05 ENCOUNTER — Ambulatory Visit: Payer: TRICARE For Life (TFL) | Admitting: Family Medicine

## 2022-06-05 DIAGNOSIS — M2141 Flat foot [pes planus] (acquired), right foot: Secondary | ICD-10-CM | POA: Diagnosis not present

## 2022-06-05 DIAGNOSIS — Q742 Other congenital malformations of lower limb(s), including pelvic girdle: Secondary | ICD-10-CM | POA: Insufficient documentation

## 2022-06-05 DIAGNOSIS — M76821 Posterior tibial tendinitis, right leg: Secondary | ICD-10-CM | POA: Diagnosis not present

## 2022-06-05 DIAGNOSIS — M79671 Pain in right foot: Secondary | ICD-10-CM | POA: Diagnosis not present

## 2022-07-26 DIAGNOSIS — M2141 Flat foot [pes planus] (acquired), right foot: Secondary | ICD-10-CM | POA: Diagnosis not present

## 2022-07-26 DIAGNOSIS — Q742 Other congenital malformations of lower limb(s), including pelvic girdle: Secondary | ICD-10-CM | POA: Diagnosis not present

## 2022-07-26 DIAGNOSIS — M76821 Posterior tibial tendinitis, right leg: Secondary | ICD-10-CM | POA: Diagnosis not present

## 2022-08-15 ENCOUNTER — Telehealth: Payer: Medicare PPO | Admitting: Emergency Medicine

## 2022-08-15 DIAGNOSIS — H1033 Unspecified acute conjunctivitis, bilateral: Secondary | ICD-10-CM | POA: Diagnosis not present

## 2022-08-15 MED ORDER — POLYMYXIN B-TRIMETHOPRIM 10000-0.1 UNIT/ML-% OP SOLN: 1.0000 [drp] | OPHTHALMIC | 0 refills | Status: AC

## 2022-08-15 NOTE — Progress Notes (Signed)
Virtual Visit Consent   Christinamarie Tall, you are scheduled for a virtual visit with a [facility] provider today. Just as with appointments in the office, your consent must be obtained to participate. Your consent will be active for this visit and any virtual visit you may have with one of our providers in the next 365 days. If you have a MyChart account, a copy of this consent can be sent to you electronically.  As this is a virtual visit, video technology does not allow for your provider to perform a traditional examination. This may limit your provider's ability to fully assess your condition. If your provider identifies any concerns that need to be evaluated in person or the need to arrange testing (such as labs, EKG, etc.), we will make arrangements to do so. Although advances in technology are sophisticated, we cannot ensure that it will always work on either your end or our end. If the connection with a video visit is poor, the visit may have to be switched to a telephone visit. With either a video or telephone visit, we are not always able to ensure that we have a secure connection.  By engaging in this virtual visit, you consent to the provision of healthcare and authorize for your insurance to be billed (if applicable) for the services provided during this visit. Depending on your insurance coverage, you may receive a charge related to this service.  I need to obtain your verbal consent now. Are you willing to proceed with your visit today? Yvette [name] has provided verbal consent on 08/15/2022 for a virtual visit (video or telephone). Montine Circle, PA-C  Date: 08/15/2022 9:46 AM  Virtual Visit via Video Note   I, Montine Circle, connected with  Yvette [name]  ([id number], 03/26/1951) on 08/15/22 at  9:45 AM EST by a video-enabled telemedicine application and verified that I am speaking with the correct person using two identifiers.  Location: Patient: Virtual Visit Location Patient:  Home Provider: Virtual Visit Location Provider: Home Office   I discussed the limitations of evaluation and management by telemedicine and the availability of in person appointments. The patient expressed understanding and agreed to proceed.    History of Present Illness: Yvette [name] is a 72 y.o. who identifies as a female who was assigned female at birth, and is being seen today for pink eye.  States that she has been using cold compresses.  She reports some small amounts of "yellow gunk."  States that it started in the left and is spreading to the right.  She doesn't wear contact lenses.  She states that the symptoms started 4 days ago.  Denies double vision or flashers.  HPI: HPI  Problems:  Patient Active Problem List   Diagnosis Date Noted   Osteoporosis 10/27/2020   Hyperlipidemia with target LDL less than 100 05/26/2015   Morbid obesity ([place]) 03/16/2015   Hypertension 02/17/2013   GERD (gastroesophageal reflux disease) 02/17/2013    Allergies: No Known Allergies Medications:  Current Outpatient Medications:    alendronate (FOSAMAX) 70 MG tablet, TAKE 1 TABLET(70 MG) BY MOUTH EVERY 7 DAYS WITH A FULL GLASS OF WATER AND ON AN EMPTY STOMACH, Disp: 12 tablet, Rfl: 3   amLODipine (NORVASC) 10 MG tablet, TAKE 1 TABLET(10 MG) BY MOUTH DAILY, Disp: 90 tablet, Rfl: 1   benazepril (LOTENSIN) 20 MG tablet, TAKE 1 TABLET(20 MG) BY MOUTH TWICE DAILY, Disp: 180 tablet, Rfl: 1   diclofenac sodium (VOLTAREN) 1 % GEL, Apply 4 g topically  4 (four) times daily., Disp: 500 g, Rfl: 1   fluticasone (FLONASE) 50 MCG/ACT nasal spray, Place 1 spray into both nostrils daily., Disp: 16 g, Rfl: 11   metoprolol succinate (TOPROL-XL) 25 MG 24 hr tablet, TAKE 1 TABLET(25 MG) BY MOUTH DAILY, Disp: 90 tablet, Rfl: 1   metroNIDAZOLE (METROGEL) 0.75 % gel, Apply to face daily to as needed, Disp: 45 g, Rfl: 2   omeprazole (PRILOSEC) 20 MG capsule, TAKE 1 CAPSULE(20 MG) BY MOUTH DAILY, Disp: 90 capsule, Rfl:  1  Observations/Objective: Patient is well-developed, well-nourished in no acute distress.  Resting comfortably at home.  Left conjunctival erythema, tearing, and mild discharge with some swelling of the eyelid, appears to have minor R sided symptoms as well. Head is normocephalic, atraumatic.  No labored breathing.  Speech is clear and coherent with logical content.  Patient is alert and oriented at baseline.    Assessment and Plan: 1. Acute conjunctivitis of both eyes, unspecified acute conjunctivitis type   Meds ordered this encounter  Medications   trimethoprim-polymyxin b (POLYTRIM) ophthalmic solution    Sig: Place 1 drop into both eyes every 4 (four) hours for 7 days.    Dispense:  10 mL    Refill:  0    Order Specific Question:   Supervising Provider    Answer:   Chase Picket [id number]   Conjunctivitis.  L>R, but spreading.  Other household members have had similar symptoms.  Will trial abx therapy.  Follow Up Instructions: I discussed the assessment and treatment plan with the patient. The patient was provided an opportunity to ask questions and all were answered. The patient agreed with the plan and demonstrated an understanding of the instructions.  A copy of instructions were sent to the patient via MyChart unless otherwise noted below.     The patient was advised to call back or seek an in-person evaluation if the symptoms worsen or if the condition fails to improve as anticipated.  Time:  I spent 11 minutes with the patient via telehealth technology discussing the above problems/concerns.    Montine Circle, PA-C

## 2022-08-15 NOTE — Patient Instructions (Signed)
  Alver Fisher, thank you for joining Montine Circle, PA-C for today's virtual visit.  While this provider is not your primary care provider (PCP), if your PCP is located in our provider database this encounter information will be shared with them immediately following your visit.   [facility] account gives you access to today's visit and all your visits, tests, and labs performed at [facility] " click here if you don't have a [facility] account or go to [email or web address]  Consent: (Patient) Yvette [name] provided verbal consent for this virtual visit at the beginning of the encounter.  Current Medications:  Current Outpatient Medications:    trimethoprim-polymyxin b (POLYTRIM) ophthalmic solution, Place 1 drop into both eyes every 4 (four) hours for 7 days., Disp: 10 mL, Rfl: 0   alendronate (FOSAMAX) 70 MG tablet, TAKE 1 TABLET(70 MG) BY MOUTH EVERY 7 DAYS WITH A FULL GLASS OF WATER AND ON AN EMPTY STOMACH, Disp: 12 tablet, Rfl: 3   amLODipine (NORVASC) 10 MG tablet, TAKE 1 TABLET(10 MG) BY MOUTH DAILY, Disp: 90 tablet, Rfl: 1   benazepril (LOTENSIN) 20 MG tablet, TAKE 1 TABLET(20 MG) BY MOUTH TWICE DAILY, Disp: 180 tablet, Rfl: 1   diclofenac sodium (VOLTAREN) 1 % GEL, Apply 4 g topically 4 (four) times daily., Disp: 500 g, Rfl: 1   fluticasone (FLONASE) 50 MCG/ACT nasal spray, Place 1 spray into both nostrils daily., Disp: 16 g, Rfl: 11   metoprolol succinate (TOPROL-XL) 25 MG 24 hr tablet, TAKE 1 TABLET(25 MG) BY MOUTH DAILY, Disp: 90 tablet, Rfl: 1   metroNIDAZOLE (METROGEL) 0.75 % gel, Apply to face daily to as needed, Disp: 45 g, Rfl: 2   omeprazole (PRILOSEC) 20 MG capsule, TAKE 1 CAPSULE(20 MG) BY MOUTH DAILY, Disp: 90 capsule, Rfl: 1   Medications ordered in this encounter:  Meds ordered this encounter  Medications   trimethoprim-polymyxin b (POLYTRIM) ophthalmic solution    Sig: Place 1 drop into both eyes every 4 (four) hours for 7  days.    Dispense:  10 mL    Refill:  0    Order Specific Question:   Supervising Provider    Answer:   Chase Picket [[id number]]     *If you need refills on other medications prior to your next appointment, please contact your pharmacy*  Follow-Up: Call back or seek an in-person evaluation if the symptoms worsen or if the condition fails to improve as anticipated.  [facility] [phone]  Other Instructions    If you have been instructed to have an in-person evaluation today at a local Urgent Care facility, please use the link below. It will take you to a list of all of our available [facility], including address, phone number and hours of operation. Please do not delay care.  [facility]  If you or a family member do not have a primary care provider, use the link below to schedule a visit and establish care. When you choose a [facility] primary care physician or advanced practice provider, you gain a long-term partner in health. Find a Primary Care Provider  Learn more about [facility]'s in-office and virtual care options: [facility]

## 2022-09-11 DIAGNOSIS — H43813 Vitreous degeneration, bilateral: Secondary | ICD-10-CM | POA: Diagnosis not present

## 2022-09-11 DIAGNOSIS — H25013 Cortical age-related cataract, bilateral: Secondary | ICD-10-CM | POA: Diagnosis not present

## 2022-09-11 DIAGNOSIS — H2513 Age-related nuclear cataract, bilateral: Secondary | ICD-10-CM | POA: Diagnosis not present

## 2022-09-11 DIAGNOSIS — H16103 Unspecified superficial keratitis, bilateral: Secondary | ICD-10-CM | POA: Diagnosis not present

## 2022-09-11 DIAGNOSIS — H04123 Dry eye syndrome of bilateral lacrimal glands: Secondary | ICD-10-CM | POA: Diagnosis not present

## 2022-09-11 DIAGNOSIS — H5203 Hypermetropia, bilateral: Secondary | ICD-10-CM | POA: Diagnosis not present

## 2022-09-11 DIAGNOSIS — H52223 Regular astigmatism, bilateral: Secondary | ICD-10-CM | POA: Diagnosis not present

## 2022-09-11 DIAGNOSIS — H524 Presbyopia: Secondary | ICD-10-CM | POA: Diagnosis not present

## 2022-09-23 ENCOUNTER — Other Ambulatory Visit: Payer: Self-pay | Admitting: Family Medicine

## 2022-09-23 DIAGNOSIS — M810 Age-related osteoporosis without current pathological fracture: Secondary | ICD-10-CM

## 2022-10-19 ENCOUNTER — Other Ambulatory Visit: Payer: Self-pay | Admitting: Family Medicine

## 2022-11-08 ENCOUNTER — Other Ambulatory Visit: Payer: Self-pay | Admitting: Family Medicine

## 2022-11-08 DIAGNOSIS — K219 Gastro-esophageal reflux disease without esophagitis: Secondary | ICD-10-CM

## 2022-11-08 DIAGNOSIS — I10 Essential (primary) hypertension: Secondary | ICD-10-CM

## 2022-11-18 DIAGNOSIS — Z1231 Encounter for screening mammogram for malignant neoplasm of breast: Secondary | ICD-10-CM | POA: Diagnosis not present

## 2022-11-18 LAB — HM MAMMOGRAPHY

## 2022-11-23 NOTE — Progress Notes (Unsigned)
[facility] at [facility] [address] [place], [place] [address] [phone]  Date:  11/25/2022   Name:  Harmany Pond   DOB:  May 02, 1951   MRN:  [id number]  PCP:  Pearline Cables, MD    Chief Complaint: No chief complaint on file.   History of Present Illness:  Marg Koebel is a 72 y.o. very pleasant female patient who presents with the following:  Patient seen today for periodic follow-up Most recent visit with myself was in October History of hypertension, hyperlipidemia, GERD, obesity, osteoporosis   She is a widow, her husband died a few years ago She has 2 adult children She is living with her daughter [name]  Second dose of Shingrix:  COVID booster Mammogram just completed Colon cancer screening up-to-date Bone density can be updated Can offer CT coronary calcium Labs done October-BMP, lipid, ferritin, CBC She is not currently on a statin, has not typically tolerated statins well  Fosamax Amlodipine 10 Benazepril 20 Toprol-XL 25 Patient Active Problem List   Diagnosis Date Noted   Osteoporosis 10/27/2020   Hyperlipidemia with target LDL less than 100 05/26/2015   Morbid obesity 03/16/2015   Hypertension 02/17/2013   GERD (gastroesophageal reflux disease) 02/17/2013    Past Medical History:  Diagnosis Date   GERD (gastroesophageal reflux disease)    Hypertension     Past Surgical History:  Procedure Laterality Date   ABDOMINAL HYSTERECTOMY     total   BREAST BIOPSY Right    knee surgery Bilateral    TONSILLECTOMY      Social History   Tobacco Use   Smoking status: Never   Smokeless tobacco: Never  Substance Use Topics   Alcohol use: Yes    Comment: once every 2 months-cocktail at night or wine   Drug use: No    Family History  Problem Relation Age of Onset   Alcohol abuse Mother    Cirrhosis Mother    Heart disease Father    Parkinson's disease Brother     No Known  Allergies  Medication list has been reviewed and updated.  Current Outpatient Medications on File Prior to Visit  Medication Sig Dispense Refill   alendronate (FOSAMAX) 70 MG tablet TAKE 1 TABLET(70 MG) BY MOUTH EVERY 7 DAYS WITH A FULL GLASS OF WATER AND ON AN EMPTY STOMACH 12 tablet 3   amLODipine (NORVASC) 10 MG tablet Take 1 tablet (10 mg total) by mouth daily. 90 tablet 0   benazepril (LOTENSIN) 20 MG tablet Take 1 tablet (20 mg total) by mouth 2 (two) times daily. 180 tablet 0   diclofenac sodium (VOLTAREN) 1 % GEL Apply 4 g topically 4 (four) times daily. 500 g 1   fluticasone (FLONASE) 50 MCG/ACT nasal spray Place 1 spray into both nostrils daily. 16 g 11   metoprolol succinate (TOPROL-XL) 25 MG 24 hr tablet TAKE 1 TABLET(25 MG) BY MOUTH DAILY 90 tablet 1   metroNIDAZOLE (METROGEL) 0.75 % gel Apply to face daily to as needed 45 g 2   omeprazole (PRILOSEC) 20 MG capsule Take 1 capsule (20 mg total) by mouth daily. 90 capsule 0   No current facility-administered medications on file prior to visit.    Review of Systems:  As per HPI- otherwise negative.   Physical Examination: There were no vitals filed for this visit. There were no vitals filed for this visit. There is no height or weight on file to calculate  BMI. Ideal Body Weight:    GEN: no acute distress. HEENT: Atraumatic, Normocephalic.  Ears and Nose: No external deformity. CV: RRR, No M/G/R. No JVD. No thrill. No extra heart sounds. PULM: CTA B, no wheezes, crackles, rhonchi. No retractions. No resp. distress. No accessory muscle use. ABD: S, NT, ND, +BS. No rebound. No HSM. EXTR: No c/c/e PSYCH: Normally interactive. Conversant.    Assessment and Plan: ***  Signed Abbe Amsterdam, MD

## 2022-11-23 NOTE — Patient Instructions (Incomplete)
It was great to see again today, will be in touch with your labs ASAP Recommend the latest COVID booster and second dose of shingles vaccine if not done already Please stop by the lab and then by imaging on the ground floor- we will get x-ray of your left hip, lower back, and also a bone density and CT coronary calcium Assuming all is well let's plan to visit in about 6 months

## 2022-11-25 ENCOUNTER — Ambulatory Visit (INDEPENDENT_AMBULATORY_CARE_PROVIDER_SITE_OTHER): Payer: TRICARE For Life (TFL) | Admitting: Family Medicine

## 2022-11-25 ENCOUNTER — Ambulatory Visit (HOSPITAL_BASED_OUTPATIENT_CLINIC_OR_DEPARTMENT_OTHER)
Admission: RE | Admit: 2022-11-25 | Discharge: 2022-11-25 | Disposition: A | Discharge status: Home / Self Care | Payer: Medicare PPO | Source: Ambulatory Visit | Attending: Family Medicine | Admitting: Family Medicine

## 2022-11-25 ENCOUNTER — Encounter: Payer: Self-pay | Admitting: Family Medicine

## 2022-11-25 VITALS — BP 132/80 | HR 89 | Temp 97.8°F | Resp 18 | Ht 63.0 in | Wt 216.4 lb

## 2022-11-25 DIAGNOSIS — E785 Hyperlipidemia, unspecified: Secondary | ICD-10-CM

## 2022-11-25 DIAGNOSIS — M25552 Pain in left hip: Secondary | ICD-10-CM | POA: Insufficient documentation

## 2022-11-25 DIAGNOSIS — M5442 Lumbago with sciatica, left side: Secondary | ICD-10-CM | POA: Insufficient documentation

## 2022-11-25 DIAGNOSIS — T466X5A Adverse effect of antihyperlipidemic and antiarteriosclerotic drugs, initial encounter: Secondary | ICD-10-CM | POA: Diagnosis not present

## 2022-11-25 DIAGNOSIS — I10 Essential (primary) hypertension: Secondary | ICD-10-CM | POA: Diagnosis not present

## 2022-11-25 DIAGNOSIS — G8929 Other chronic pain: Secondary | ICD-10-CM

## 2022-11-25 DIAGNOSIS — K219 Gastro-esophageal reflux disease without esophagitis: Secondary | ICD-10-CM

## 2022-11-25 DIAGNOSIS — M816 Localized osteoporosis [Lequesne]: Secondary | ICD-10-CM

## 2022-11-25 DIAGNOSIS — M1612 Unilateral primary osteoarthritis, left hip: Secondary | ICD-10-CM | POA: Diagnosis not present

## 2022-11-25 DIAGNOSIS — M545 Low back pain, unspecified: Secondary | ICD-10-CM | POA: Diagnosis not present

## 2022-11-25 DIAGNOSIS — Z1329 Encounter for screening for other suspected endocrine disorder: Secondary | ICD-10-CM

## 2022-11-25 DIAGNOSIS — R5383 Other fatigue: Secondary | ICD-10-CM | POA: Diagnosis not present

## 2022-11-25 DIAGNOSIS — Z131 Encounter for screening for diabetes mellitus: Secondary | ICD-10-CM

## 2022-11-25 DIAGNOSIS — G720 Drug-induced myopathy: Secondary | ICD-10-CM | POA: Diagnosis not present

## 2022-11-25 LAB — HEMOGLOBIN A1C: Hgb A1c MFr Bld: 5.6 % (ref 4.6–6.5)

## 2022-11-25 LAB — CBC
HCT: 38 % (ref 36.0–46.0)
Hemoglobin: 12.7 g/dL (ref 12.0–15.0)
MCHC: 33.5 g/dL (ref 30.0–36.0)
MCV: 93.2 fl (ref 78.0–100.0)
Platelets: 359 10*3/uL (ref 150.0–400.0)
RBC: 4.08 Mil/uL (ref 3.87–5.11)
RDW: 13.6 % (ref 11.5–15.5)
WBC: 6.4 10*3/uL (ref 4.0–10.5)

## 2022-11-25 LAB — COMPREHENSIVE METABOLIC PANEL
ALT: 13 U/L (ref 0–35)
AST: 14 U/L (ref 0–37)
Albumin: 4.5 g/dL (ref 3.5–5.2)
Alkaline Phosphatase: 57 U/L (ref 39–117)
BUN: 15 mg/dL (ref 6–23)
CO2: 25 mEq/L (ref 19–32)
Calcium: 9.8 mg/dL (ref 8.4–10.5)
Chloride: 105 mEq/L (ref 96–112)
Creatinine, Ser: 0.87 mg/dL (ref 0.40–1.20)
GFR: 66.65 mL/min (ref >60.00)
Glucose, Bld: 89 mg/dL (ref 70–99)
Potassium: 4.9 mEq/L (ref 3.5–5.1)
Sodium: 138 mEq/L (ref 135–145)
Total Bilirubin: 0.3 mg/dL (ref 0.2–1.2)
Total Protein: 6.9 g/dL (ref 6.0–8.3)

## 2022-11-25 LAB — TSH: TSH: 2.86 u[IU]/mL (ref 0.35–5.50)

## 2022-11-25 LAB — LDL CHOLESTEROL, DIRECT: Direct LDL: 140 mg/dL

## 2022-11-25 LAB — LIPID PANEL
Cholesterol: 224 mg/dL — ABNORMAL HIGH (ref 0–200)
HDL: 47.3 mg/dL (ref >39.00)
NonHDL: 176.44
Total CHOL/HDL Ratio: 5
Triglycerides: 210 mg/dL — ABNORMAL HIGH (ref 0.0–149.0)
VLDL: 42 mg/dL — ABNORMAL HIGH (ref 0.0–40.0)

## 2022-11-25 LAB — VITAMIN D 25 HYDROXY (VIT D DEFICIENCY, FRACTURES): VITD: 42.97 ng/mL (ref 30.00–100.00)

## 2022-11-25 MED ORDER — OMEPRAZOLE 20 MG PO CPDR: 20.0000 mg | DELAYED_RELEASE_CAPSULE | Freq: Every day | ORAL | 0 refills | Status: DC

## 2022-11-25 MED ORDER — AMLODIPINE BESYLATE 10 MG PO TABS: 10.0000 mg | ORAL_TABLET | Freq: Every day | ORAL | 0 refills | Status: DC

## 2022-11-25 MED ORDER — BENAZEPRIL HCL 20 MG PO TABS: 20.0000 mg | ORAL_TABLET | Freq: Two times a day (BID) | ORAL | 0 refills | Status: DC

## 2022-11-26 ENCOUNTER — Encounter: Payer: Self-pay | Admitting: Family Medicine

## 2022-11-26 DIAGNOSIS — M25552 Pain in left hip: Secondary | ICD-10-CM

## 2022-11-26 DIAGNOSIS — G8929 Other chronic pain: Secondary | ICD-10-CM

## 2022-12-03 ENCOUNTER — Ambulatory Visit (HOSPITAL_BASED_OUTPATIENT_CLINIC_OR_DEPARTMENT_OTHER)
Admission: RE | Admit: 2022-12-03 | Discharge: 2022-12-03 | Disposition: A | Discharge status: Home / Self Care | Payer: Self-pay | Source: Ambulatory Visit | Attending: Family Medicine | Admitting: Family Medicine

## 2022-12-03 ENCOUNTER — Encounter: Payer: Self-pay | Admitting: Family Medicine

## 2022-12-03 ENCOUNTER — Ambulatory Visit (HOSPITAL_BASED_OUTPATIENT_CLINIC_OR_DEPARTMENT_OTHER)
Admission: RE | Admit: 2022-12-03 | Discharge: 2022-12-03 | Disposition: A | Discharge status: Home / Self Care | Payer: Medicare PPO | Source: Ambulatory Visit | Attending: Family Medicine | Admitting: Family Medicine

## 2022-12-03 DIAGNOSIS — M816 Localized osteoporosis [Lequesne]: Secondary | ICD-10-CM | POA: Diagnosis not present

## 2022-12-03 DIAGNOSIS — R931 Abnormal findings on diagnostic imaging of heart and coronary circulation: Secondary | ICD-10-CM

## 2022-12-03 DIAGNOSIS — M85831 Other specified disorders of bone density and structure, right forearm: Secondary | ICD-10-CM | POA: Diagnosis not present

## 2022-12-03 DIAGNOSIS — Z780 Asymptomatic menopausal state: Secondary | ICD-10-CM | POA: Diagnosis not present

## 2022-12-03 DIAGNOSIS — E785 Hyperlipidemia, unspecified: Secondary | ICD-10-CM | POA: Insufficient documentation

## 2022-12-03 DIAGNOSIS — G720 Drug-induced myopathy: Secondary | ICD-10-CM

## 2022-12-03 NOTE — Addendum Note (Signed)
Addended by: Abbe Amsterdam C on: 12/03/2022 02:38 PM   Modules accepted: Orders

## 2022-12-04 NOTE — Addendum Note (Signed)
Addended by: Abbe Amsterdam C on: 12/04/2022 08:55 AM   Modules accepted: Orders

## 2022-12-06 ENCOUNTER — Telehealth: Payer: Self-pay

## 2022-12-06 NOTE — Telephone Encounter (Signed)
[facility] called to let us know that the pts imaging came back, and that it was abnl. A 6mm pulm nodule was found and her score is Total: 489

## 2022-12-07 ENCOUNTER — Encounter: Payer: Self-pay | Admitting: Family Medicine

## 2022-12-07 DIAGNOSIS — M25552 Pain in left hip: Secondary | ICD-10-CM

## 2022-12-07 DIAGNOSIS — R911 Solitary pulmonary nodule: Secondary | ICD-10-CM

## 2022-12-11 DIAGNOSIS — M48062 Spinal stenosis, lumbar region with neurogenic claudication: Secondary | ICD-10-CM | POA: Diagnosis not present

## 2022-12-11 DIAGNOSIS — M25552 Pain in left hip: Secondary | ICD-10-CM | POA: Diagnosis not present

## 2022-12-26 DIAGNOSIS — M47816 Spondylosis without myelopathy or radiculopathy, lumbar region: Secondary | ICD-10-CM | POA: Diagnosis not present

## 2022-12-26 DIAGNOSIS — M48061 Spinal stenosis, lumbar region without neurogenic claudication: Secondary | ICD-10-CM | POA: Diagnosis not present

## 2022-12-26 DIAGNOSIS — M48062 Spinal stenosis, lumbar region with neurogenic claudication: Secondary | ICD-10-CM | POA: Diagnosis not present

## 2022-12-26 DIAGNOSIS — M5126 Other intervertebral disc displacement, lumbar region: Secondary | ICD-10-CM | POA: Diagnosis not present

## 2023-01-08 DIAGNOSIS — M48062 Spinal stenosis, lumbar region with neurogenic claudication: Secondary | ICD-10-CM | POA: Diagnosis not present

## 2023-01-21 DIAGNOSIS — M1612 Unilateral primary osteoarthritis, left hip: Secondary | ICD-10-CM | POA: Insufficient documentation

## 2023-01-21 DIAGNOSIS — M5136 Other intervertebral disc degeneration, lumbar region: Secondary | ICD-10-CM | POA: Diagnosis not present

## 2023-01-21 DIAGNOSIS — M47816 Spondylosis without myelopathy or radiculopathy, lumbar region: Secondary | ICD-10-CM | POA: Insufficient documentation

## 2023-01-21 DIAGNOSIS — M48062 Spinal stenosis, lumbar region with neurogenic claudication: Secondary | ICD-10-CM | POA: Diagnosis not present

## 2023-02-05 DIAGNOSIS — R030 Elevated blood-pressure reading, without diagnosis of hypertension: Secondary | ICD-10-CM | POA: Diagnosis not present

## 2023-02-05 DIAGNOSIS — H6691 Otitis media, unspecified, right ear: Secondary | ICD-10-CM | POA: Diagnosis not present

## 2023-02-05 DIAGNOSIS — Z6839 Body mass index (BMI) 39.0-39.9, adult: Secondary | ICD-10-CM | POA: Diagnosis not present

## 2023-02-05 DIAGNOSIS — E669 Obesity, unspecified: Secondary | ICD-10-CM | POA: Diagnosis not present

## 2023-02-07 ENCOUNTER — Encounter: Payer: Self-pay | Admitting: Family Medicine

## 2023-02-07 DIAGNOSIS — H9201 Otalgia, right ear: Secondary | ICD-10-CM | POA: Diagnosis not present

## 2023-02-07 DIAGNOSIS — H9209 Otalgia, unspecified ear: Secondary | ICD-10-CM

## 2023-02-07 MED ORDER — OFLOXACIN 0.3 % OT SOLN: 10.0000 [drp] | Freq: Every day | OTIC | 0 refills | Status: DC

## 2023-02-07 NOTE — Addendum Note (Signed)
Addended by: Pearline Cables on: 02/07/2023 07:27 PM   Modules accepted: Orders

## 2023-02-08 MED ORDER — OFLOXACIN 0.3 % OT SOLN: 10.0000 [drp] | Freq: Every day | OTIC | 0 refills | Status: DC

## 2023-02-08 NOTE — Addendum Note (Signed)
Addended by: Abbe Amsterdam C on: 02/08/2023 06:04 AM   Modules accepted: Orders

## 2023-02-08 NOTE — Telephone Encounter (Signed)
Please see the MyChart message reply(ies) for my assessment and plan.  The patient gave consent for this Medical Advice Message and is aware that it may result in a bill to their insurance company as well as the possibility that this may result in a co-payment or deductible. They are an established patient, but are not seeking medical advice exclusively about a problem treated during an in person or video visit in the last 7 days. I did not recommend an in person or video visit within 7 days of my reply.  I spent a total of 10 minutes cumulative time within 7 days through MyChart messaging Loyd Salvador, MD  

## 2023-02-11 ENCOUNTER — Ambulatory Visit: Payer: TRICARE For Life (TFL) | Admitting: Cardiology

## 2023-02-12 DIAGNOSIS — Z1211 Encounter for screening for malignant neoplasm of colon: Secondary | ICD-10-CM | POA: Insufficient documentation

## 2023-02-17 ENCOUNTER — Encounter: Payer: Self-pay | Admitting: Cardiology

## 2023-02-17 ENCOUNTER — Ambulatory Visit: Payer: Medicare PPO | Attending: Cardiology | Admitting: Cardiology

## 2023-02-17 VITALS — BP 142/84 | HR 70 | Ht 63.5 in | Wt 216.0 lb

## 2023-02-17 DIAGNOSIS — I2584 Coronary atherosclerosis due to calcified coronary lesion: Secondary | ICD-10-CM | POA: Diagnosis not present

## 2023-02-17 DIAGNOSIS — I10 Essential (primary) hypertension: Secondary | ICD-10-CM | POA: Diagnosis not present

## 2023-02-17 DIAGNOSIS — E785 Hyperlipidemia, unspecified: Secondary | ICD-10-CM | POA: Diagnosis not present

## 2023-02-17 DIAGNOSIS — R0609 Other forms of dyspnea: Secondary | ICD-10-CM

## 2023-02-17 DIAGNOSIS — R072 Precordial pain: Secondary | ICD-10-CM

## 2023-02-17 DIAGNOSIS — I2510 Atherosclerotic heart disease of native coronary artery without angina pectoris: Secondary | ICD-10-CM | POA: Diagnosis not present

## 2023-02-17 MED ORDER — EZETIMIBE 10 MG PO TABS: 10.0000 mg | ORAL_TABLET | Freq: Every day | ORAL | 3 refills | Status: DC

## 2023-02-17 MED ORDER — METOPROLOL TARTRATE 100 MG PO TABS: ORAL_TABLET | ORAL | 0 refills | Status: DC

## 2023-02-17 NOTE — Patient Instructions (Addendum)
Medication Instructions:   START: Zetia 10mg  1 daily  TAKE: Metoprolol 100mg  1 tablet 2 hours prior to CT scan   Lab Work: [address] BMP- today If you have labs (blood work) drawn today and your tests are completely normal, you will receive your results only by: [employer] (if you have MyChart) OR A paper copy in the mail If you have any lab test that is abnormal or we need to change your treatment, we will call you to review the results.   Testing/Procedures:  Your physician has requested that you have an echocardiogram. Echocardiography is a painless test that uses sound waves to create images of your heart. It provides your doctor with information about the size and shape of your heart and how well your heart's chambers and valves are working. This procedure takes approximately one hour. There are no restrictions for this procedure. Please do NOT wear cologne, perfume, aftershave, or lotions (deodorant is allowed). Please arrive 15 minutes prior to your appointment time.   Your cardiac CT will be scheduled at one of the below locations:   [facility] [address] [place], [place] [address] [phone]   If scheduled at [facility], please arrive at the [facility] and Children's Entrance (Entrance C2) of [facility] 30 minutes prior to test start time. You can use the FREE valet parking offered at entrance C (encouraged to control the heart rate for the test)  Proceed to the [facility] Radiology Department (first floor) to check-in and test prep.  All radiology patients and guests should use entrance C2 at [facility], accessed from [facility], even though the hospital's physical address listed is [address].      Please follow these instructions carefully (unless otherwise directed):    On the Night Before the Test: Be sure to Drink plenty of water. Do not consume any  caffeinated/decaffeinated beverages or chocolate 12 hours prior to your test. Do not take any antihistamines 12 hours prior to your test.  On the Day of the Test: Drink plenty of water until 1 hour prior to the test. Do not eat any food 4 hours prior to the test. You may take your regular medications prior to the test.  Take metoprolol (Lopressor) two hours prior to test. FEMALES- please wear underwire-free bra if available, avoid dresses & tight clothing       After the Test: Drink plenty of water. After receiving IV contrast, you may experience a mild flushed feeling. This is normal. On occasion, you may experience a mild rash up to 24 hours after the test. This is not dangerous. If this occurs, you can take Benadryl 25 mg and increase your fluid intake. If you experience trouble breathing, this can be serious. If it is severe call 911 IMMEDIATELY. If it is mild, please call our office. If you take any of these medications: Glipizide/Metformin, Avandament, Glucavance, please do not take 48 hours after completing test unless otherwise instructed.  We will call to schedule your test 2-4 weeks out understanding that some insurance companies will need an authorization prior to the service being performed.   For non-scheduling related questions, please contact the cardiac imaging nurse navigator should you have any questions/concerns: Rockwell Alexandria, Cardiac Imaging Nurse Navigator Larey Brick, Cardiac Imaging Nurse Navigator [facility] Direct Office Dial: [phone]   For scheduling needs, including cancellations and rescheduling, please call [place], [phone].  Follow-Up: At [facility], you and your health needs are our priority.  As part of our continuing mission to provide you with exceptional heart care, we have created designated Provider Care Teams.  These Care Teams include your primary Cardiologist (physician) and Advanced Practice  Providers (APPs -  Physician Assistants and Nurse Practitioners) who all work together to provide you with the care you need, when you need it.  We recommend signing up for the patient portal called "MyChart".  Sign up information is provided on this After Visit Summary.  MyChart is used to connect with patients for Virtual Visits (Telemedicine).  Patients are able to view lab/test results, encounter notes, upcoming appointments, etc.  Non-urgent messages can be sent to your provider as well.   To learn more about what you can do with MyChart, go to [email or web address].    Your next appointment:   2 month(s)  The format for your next appointment:   In Person  Provider:   Gypsy Balsam, MD    Other Instructions NA

## 2023-02-17 NOTE — Progress Notes (Unsigned)
Cardiology Consultation:    Date:  02/17/2023   ID:  Ralene Ok, DOB 1951/06/19, MRN [id number]  PCP:  Pearline Cables, MD  Cardiologist:  Gypsy Balsam, MD   Referring MD: Pearline Cables, MD   Chief Complaint  Patient presents with   Elevated Calcium Score     History of Present Illness:    Yvette [name] is a 72 y.o. female who is being seen today for the evaluation of elevated calcium score at the request of Copland, Gwenlyn Found, MD. past medical history significant for essential hypertension, dyslipidemia with intolerance to statin, secondhand smoking, family history of coronary artery disease but not premature.  She did have calcium score done results is 489 which is 90 percentile.  She comes here to talk about this.  Overall she is doing fine.  She does have some difficulty moving or walking too much because of some problem with her feet.  She described to have some fatigue tiredness shortness of breath while walking but not typical chest pain tightness squeezing pressure burning chest.  She never smoked but she lives with her husband who was a smoker, she does not exercise on the regular basis she is not on any special diet.  Does have family of coronary artery disease but not premature.  Past Medical History:  Diagnosis Date   GERD (gastroesophageal reflux disease)    Hypertension     Past Surgical History:  Procedure Laterality Date   ABDOMINAL HYSTERECTOMY     total   BREAST BIOPSY Right    knee surgery Bilateral    TONSILLECTOMY      Current Medications: Current Meds  Medication Sig   alendronate (FOSAMAX) 70 MG tablet TAKE 1 TABLET(70 MG) BY MOUTH EVERY 7 DAYS WITH A FULL GLASS OF WATER AND ON AN EMPTY STOMACH (Patient taking differently: Take 70 mg by mouth.)   amLODipine (NORVASC) 10 MG tablet Take 1 tablet (10 mg total) by mouth daily.   benazepril (LOTENSIN) 20 MG tablet Take 1 tablet (20 mg total) by mouth 2 (two) times daily.   diclofenac sodium  (VOLTAREN) 1 % GEL Apply 4 g topically 4 (four) times daily.   fluticasone (FLONASE) 50 MCG/ACT nasal spray Place 1 spray into both nostrils daily.   metoprolol succinate (TOPROL-XL) 25 MG 24 hr tablet TAKE 1 TABLET(25 MG) BY MOUTH DAILY (Patient taking differently: Take 25 mg by mouth daily.)   omeprazole (PRILOSEC) 20 MG capsule Take 1 capsule (20 mg total) by mouth daily.   [DISCONTINUED] ofloxacin (FLOXIN) 0.3 % OTIC solution Place 10 drops into the right ear daily. Use for 7- 10 days     Allergies:   Patient has no known allergies.   Social History   Socioeconomic History   Marital status: Divorced    Spouse name: Not on file   Number of children: Not on file   Years of education: Not on file   Highest education level: Not on file  Occupational History   Not on file  Tobacco Use   Smoking status: Never   Smokeless tobacco: Never  Substance and Sexual Activity   Alcohol use: Yes    Comment: once every 2 months-cocktail at night or wine   Drug use: No   Sexual activity: Not on file  Other Topics Concern   Not on file  Social History Narrative   Not on file   Social Determinants of Health   Financial Resource Strain: Low Risk  (04/10/2021)  Overall Financial Resource Strain (CARDIA)    Difficulty of Paying Living Expenses: Not hard at all  Food Insecurity: No Food Insecurity (04/16/2022)   Hunger Vital Sign    Worried About Running Out of Food in the Last Year: Never true    Ran Out of Food in the Last Year: Never true  Transportation Needs: No Transportation Needs (04/16/2022)   PRAPARE - Administrator, Civil Service (Medical): No    Lack of Transportation (Non-Medical): No  Physical Activity: Inactive (04/10/2021)   Exercise Vital Sign    Days of Exercise per Week: 0 days    Minutes of Exercise per Session: 0 min  Stress: No Stress Concern Present (04/16/2022)   [employer] of Occupational Health - Occupational Stress Questionnaire    Feeling of  Stress : Not at all  Social Connections: Unknown (04/16/2022)   Social Connection and Isolation Panel [NHANES]    Frequency of Communication with Friends and Family: More than three times a week    Frequency of Social Gatherings with Friends and Family: More than three times a week    Attends Religious Services: Not on Marketing executive or Organizations: Yes    Attends Engineer, structural: More than 4 times per year    Marital Status: Not on file     Family History: The patient's family history includes Alcohol abuse in her mother; Cirrhosis in her mother; Heart disease in her father; Parkinson's disease in her brother. ROS:   Please see the history of present illness.    All 14 point review of systems negative except as described per history of present illness.  EKGs/Labs/Other Studies Reviewed:    The following studies were reviewed today:   EKG:   Normal sinus rhythm, normal P interval, evidence of LVH  Recent Labs: 11/25/2022: ALT 13; BUN 15; Creatinine, Ser 0.87; Hemoglobin 12.7; Platelets 359.0; Potassium 4.9; Sodium 138; TSH 2.86  Recent Lipid Panel    Component Value Date/Time   CHOL 224 (H) 11/25/2022 1040   CHOL 213 (H) 11/24/2015 1110   CHOL 211 (H) 02/17/2013 1645   TRIG 210.0 (H) 11/25/2022 1040   TRIG 151 (H) 09/26/2014 1703   TRIG 112 02/17/2013 1645   HDL 47.30 11/25/2022 1040   HDL 41 11/24/2015 1110   HDL 51 09/26/2014 1703   HDL 50 02/17/2013 1645   CHOLHDL 5 11/25/2022 1040   VLDL 42.0 (H) 11/25/2022 1040   LDLCALC 134 (H) 05/23/2022 0930   LDLCALC 132 (H) 11/24/2015 1110   LDLCALC 124 (H) 10/26/2013 1640   LDLCALC 139 (H) 02/17/2013 1645   LDLDIRECT 140.0 11/25/2022 1040    Physical Exam:    VS:  BP (!) 142/84 (BP Location: Left Arm, Patient Position: Sitting)   Pulse 70   Ht 5' 3.5" (1.613 m)   Wt 216 lb (98 kg)   SpO2 97%   BMI 37.66 kg/m     Wt Readings from Last 3 Encounters:  02/17/23 216 lb (98 kg)  11/25/22  216 lb 6.4 oz (98.2 kg)  05/23/22 218 lb 9.6 oz (99.2 kg)     GEN:  Well nourished, well developed in no acute distress HEENT: Normal NECK: No JVD; No carotid bruits LYMPHATICS: No lymphadenopathy CARDIAC: RRR, no murmurs, no rubs, no gallops RESPIRATORY:  Clear to auscultation without rales, wheezing or rhonchi  ABDOMEN: Soft, non-tender, non-distended MUSCULOSKELETAL:  No edema; No deformity  SKIN: Warm and dry NEUROLOGIC:  Alert and oriented x 3 PSYCHIATRIC:  Normal affect   ASSESSMENT:    1. Coronary artery disease without angina pectoris, unspecified vessel or lesion type, unspecified whether native or transplanted heart   2. Coronary artery calcification calcium score 489 which is 90% percentile   3. Primary hypertension   4. Hyperlipidemia with target LDL less than 100   5. Dyspnea on exertion    PLAN:    In order of problems listed above:  Coronary artery disease.  Calcification of the coronary arteries noted on calcium score with calcium score 489 which is 90 percentile.  We had a long discussion about what to do with the situation.  I think we need to make sure she does not have any obstructive disease as well as we need to do testing that allowed [place] to better correct with the characteristic of the plaquing.  On the left that she does have dyspnea on exertion with more about potential anginal equivalent.  Therefore, we will schedule her to do coronary CT angio.  Procedure explained including all risk benefits as alternative.  I asked her to start taking 1 baby aspirin every single day. Dyslipidemia I did review her cholesterol her LDL is 134 HDL 47 I did calculated MeSA 10 years risk for coronary artery disease which came 13.7% which is intermediate.  She tried statin with difficulties.  I will give her Zetia 10 mg daily hopefully she will be able to tolerate it.  Will wait for coronary CT angio which allowed [place] to more precisely how aggressive we need to be with  cholesterol-lowering medication. Enlarged pulmonary artery noted on the CT.  Will schedule her to have echocardiogram to assess pulmonary pressures. I recommend start antiplatelet therapy in form of aspirin every single day, coronary CT angio will be done an echocardiogram.   Medication Adjustments/Labs and Tests Ordered: Current medicines are reviewed at length with the patient today.  Concerns regarding medicines are outlined above.  Orders Placed This Encounter  Procedures   EKG 12-Lead   No orders of the defined types were placed in this encounter.   Signed, Georgeanna Lea, MD, [facility]. 02/17/2023 11:15 AM    [facility]

## 2023-02-21 ENCOUNTER — Encounter (HOSPITAL_COMMUNITY): Payer: Self-pay

## 2023-02-25 ENCOUNTER — Ambulatory Visit (HOSPITAL_COMMUNITY)
Admission: RE | Admit: 2023-02-25 | Discharge: 2023-02-25 | Disposition: A | Discharge status: Home / Self Care | Payer: Medicare PPO | Source: Ambulatory Visit | Attending: Cardiology | Admitting: Cardiology

## 2023-02-25 DIAGNOSIS — R072 Precordial pain: Secondary | ICD-10-CM | POA: Diagnosis not present

## 2023-02-25 MED ORDER — NITROGLYCERIN 0.4 MG SL SUBL
0.8000 mg | SUBLINGUAL_TABLET | Freq: Once | SUBLINGUAL | Status: AC
  Administered 2023-02-25: 0.8 mg via SUBLINGUAL

## 2023-02-25 MED ORDER — IOHEXOL 350 MG/ML SOLN
95.0000 mL | Freq: Once | INTRAVENOUS | Status: AC | PRN
  Administered 2023-02-25: 95 mL via INTRAVENOUS

## 2023-02-25 MED ORDER — NITROGLYCERIN 0.4 MG SL SUBL
SUBLINGUAL_TABLET | SUBLINGUAL | Status: AC
  Filled 2023-02-25: qty 2

## 2023-03-04 ENCOUNTER — Telehealth: Payer: Self-pay

## 2023-03-04 NOTE — Telephone Encounter (Signed)
 Results reviewed with pt as per Dr. Krasowski's note.  Pt verbalized understanding and had no additional questions. Routed to PCP  

## 2023-03-06 ENCOUNTER — Ambulatory Visit (HOSPITAL_COMMUNITY)
Admission: RE | Admit: 2023-03-06 | Discharge: 2023-03-06 | Disposition: A | Discharge status: Home / Self Care | Payer: Medicare PPO | Source: Ambulatory Visit | Attending: Family Medicine | Admitting: Family Medicine

## 2023-03-06 DIAGNOSIS — I2510 Atherosclerotic heart disease of native coronary artery without angina pectoris: Secondary | ICD-10-CM | POA: Diagnosis not present

## 2023-03-06 DIAGNOSIS — I080 Rheumatic disorders of both mitral and aortic valves: Secondary | ICD-10-CM | POA: Diagnosis not present

## 2023-03-06 DIAGNOSIS — I10 Essential (primary) hypertension: Secondary | ICD-10-CM | POA: Diagnosis not present

## 2023-03-06 DIAGNOSIS — I2584 Coronary atherosclerosis due to calcified coronary lesion: Secondary | ICD-10-CM | POA: Diagnosis not present

## 2023-03-06 LAB — ECHOCARDIOGRAM COMPLETE
AR max vel: 1.4 cm2
AV Area VTI: 1.4 cm2
AV Area mean vel: 1.24 cm2
AV Mean grad: 14 mmHg
AV Peak grad: 22.7 mmHg
Ao pk vel: 2.38 m/s
Area-P 1/2: 2.94 cm2
MV VTI: 2.94 cm2
P 1/2 time: 657 msec
S' Lateral: 3.5 cm

## 2023-03-19 DIAGNOSIS — I10 Essential (primary) hypertension: Secondary | ICD-10-CM | POA: Diagnosis not present

## 2023-03-20 LAB — BASIC METABOLIC PANEL WITH GFR
BUN/Creatinine Ratio: 23 (ref 12–28)
BUN: 21 mg/dL (ref 8–27)
CO2: 21 mmol/L (ref 20–29)
Calcium: 9.9 mg/dL (ref 8.7–10.3)
Chloride: 107 mmol/L — ABNORMAL HIGH (ref 96–106)
Creatinine, Ser: 0.9 mg/dL (ref 0.57–1.00)
Glucose: 96 mg/dL (ref 70–99)
Potassium: 5 mmol/L (ref 3.5–5.2)
Sodium: 144 mmol/L (ref 134–144)
eGFR: 68 mL/min/{1.73_m2} (ref >59)

## 2023-04-24 ENCOUNTER — Ambulatory Visit (INDEPENDENT_AMBULATORY_CARE_PROVIDER_SITE_OTHER): Payer: Medicare PPO | Admitting: *Deleted

## 2023-04-24 DIAGNOSIS — Z0000 Encounter for general adult medical examination without abnormal findings: Secondary | ICD-10-CM | POA: Diagnosis not present

## 2023-04-24 NOTE — Patient Instructions (Signed)
[name] , Thank you for taking time to come for your Medicare Wellness Visit. I appreciate your ongoing commitment to your health goals. Please review the following plan we discussed and let me know if I can assist you in the future.     This is a list of the screening recommended for you and due dates:  Health Maintenance  Topic Date Due   Zoster (Shingles) Vaccine (2 of 2) 07/14/2022   DTaP/Tdap/Td vaccine (2 - Td or Tdap) 02/18/2023   COVID-19 Vaccine (6 - 2023-24 season) 04/20/2023   Flu Shot  11/17/2023*   Medicare Annual Wellness Visit  04/23/2024   Mammogram  11/17/2024   Colon Cancer Screening  04/15/2027   Pneumonia Vaccine  Completed   DEXA scan (bone density measurement)  Completed   Hepatitis C Screening  Completed   HPV Vaccine  Aged Out  *Topic was postponed. The date shown is not the original due date.    Next appointment: Follow up in one year for your annual wellness visit.   Preventive Care 70 Years and Older, Female Preventive care refers to lifestyle choices and visits with your health care provider that can promote health and wellness. What does preventive care include? A yearly physical exam. This is also called an annual well check. Dental exams once or twice a year. Routine eye exams. Ask your health care provider how often you should have your eyes checked. Personal lifestyle choices, including: Daily care of your teeth and gums. Regular physical activity. Eating a healthy diet. Avoiding tobacco and drug use. Limiting alcohol use. Practicing safe sex. Taking low-dose aspirin every day. Taking vitamin and mineral supplements as recommended by your health care provider. What happens during an annual well check? The services and screenings done by your health care provider during your annual well check will depend on your age, overall health, lifestyle risk factors, and family history of disease. Counseling  Your health care provider may ask you  questions about your: Alcohol use. Tobacco use. Drug use. Emotional well-being. Home and relationship well-being. Sexual activity. Eating habits. History of falls. Memory and ability to understand (cognition). Work and work Astronomer. Reproductive health. Screening  You may have the following tests or measurements: Height, weight, and BMI. Blood pressure. Lipid and cholesterol levels. These may be checked every 5 years, or more frequently if you are over 31 years old. Skin check. Lung cancer screening. You may have this screening every year starting at age 65 if you have a 30-pack-year history of smoking and currently smoke or have quit within the past 15 years. Fecal occult blood test (FOBT) of the stool. You may have this test every year starting at age 36. Flexible sigmoidoscopy or colonoscopy. You may have a sigmoidoscopy every 5 years or a colonoscopy every 10 years starting at age 38. Hepatitis C blood test. Hepatitis B blood test. Sexually transmitted disease (STD) testing. Diabetes screening. This is done by checking your blood sugar (glucose) after you have not eaten for a while (fasting). You may have this done every 1-3 years. Bone density scan. This is done to screen for osteoporosis. You may have this done starting at age 48. Mammogram. This may be done every 1-2 years. Talk to your health care provider about how often you should have regular mammograms. Talk with your health care provider about your test results, treatment options, and if necessary, the need for more tests. Vaccines  Your health care provider may recommend certain vaccines, such as:  Influenza vaccine. This is recommended every year. Tetanus, diphtheria, and acellular pertussis (Tdap, Td) vaccine. You may need a Td booster every 10 years. Zoster vaccine. You may need this after age 22. Pneumococcal 13-valent conjugate (PCV13) vaccine. One dose is recommended after age 25. Pneumococcal polysaccharide  (PPSV23) vaccine. One dose is recommended after age 75. Talk to your health care provider about which screenings and vaccines you need and how often you need them. This information is not intended to replace advice given to you by your health care provider. Make sure you discuss any questions you have with your health care provider. Document Released: 09/01/2015 Document Revised: 04/24/2016 Document Reviewed: 06/06/2015 Elsevier Interactive Patient Education  2017 [employer].  Fall Prevention in the Home Falls can cause injuries. They can happen to people of all ages. There are many things you can do to make your home safe and to help prevent falls. What can I do on the outside of my home? Regularly fix the edges of walkways and driveways and fix any cracks. Remove anything that might make you trip as you walk through a door, such as a raised step or threshold. Trim any bushes or trees on the path to your home. Use bright outdoor lighting. Clear any walking paths of anything that might make someone trip, such as rocks or tools. Regularly check to see if handrails are loose or broken. Make sure that both sides of any steps have handrails. Any raised decks and porches should have guardrails on the edges. Have any leaves, snow, or ice cleared regularly. Use sand or salt on walking paths during winter. Clean up any spills in your garage right away. This includes oil or grease spills. What can I do in the bathroom? Use night lights. Install grab bars by the toilet and in the tub and shower. Do not use towel bars as grab bars. Use non-skid mats or decals in the tub or shower. If you need to sit down in the shower, use a plastic, non-slip stool. Keep the floor dry. Clean up any water that spills on the floor as soon as it happens. Remove soap buildup in the tub or shower regularly. Attach bath mats securely with double-sided non-slip rug tape. Do not have throw rugs and other things on the  floor that can make you trip. What can I do in the bedroom? Use night lights. Make sure that you have a light by your bed that is easy to reach. Do not use any sheets or blankets that are too big for your bed. They should not hang down onto the floor. Have a firm chair that has side arms. You can use this for support while you get dressed. Do not have throw rugs and other things on the floor that can make you trip. What can I do in the kitchen? Clean up any spills right away. Avoid walking on wet floors. Keep items that you use a lot in easy-to-reach places. If you need to reach something above you, use a strong step stool that has a grab bar. Keep electrical cords out of the way. Do not use floor polish or wax that makes floors slippery. If you must use wax, use non-skid floor wax. Do not have throw rugs and other things on the floor that can make you trip. What can I do with my stairs? Do not leave any items on the stairs. Make sure that there are handrails on both sides of the stairs and use them.  Fix handrails that are broken or loose. Make sure that handrails are as long as the stairways. Check any carpeting to make sure that it is firmly attached to the stairs. Fix any carpet that is loose or worn. Avoid having throw rugs at the top or bottom of the stairs. If you do have throw rugs, attach them to the floor with carpet tape. Make sure that you have a light switch at the top of the stairs and the bottom of the stairs. If you do not have them, ask someone to add them for you. What else can I do to help prevent falls? Wear shoes that: Do not have high heels. Have rubber bottoms. Are comfortable and fit you well. Are closed at the toe. Do not wear sandals. If you use a stepladder: Make sure that it is fully opened. Do not climb a closed stepladder. Make sure that both sides of the stepladder are locked into place. Ask someone to hold it for you, if possible. Clearly mark and make  sure that you can see: Any grab bars or handrails. First and last steps. Where the edge of each step is. Use tools that help you move around (mobility aids) if they are needed. These include: Canes. Walkers. Scooters. Crutches. Turn on the lights when you go into a dark area. Replace any light bulbs as soon as they burn out. Set up your furniture so you have a clear path. Avoid moving your furniture around. If any of your floors are uneven, fix them. If there are any pets around you, be aware of where they are. Review your medicines with your doctor. Some medicines can make you feel dizzy. This can increase your chance of falling. Ask your doctor what other things that you can do to help prevent falls. This information is not intended to replace advice given to you by your health care provider. Make sure you discuss any questions you have with your health care provider. Document Released: 06/01/2009 Document Revised: 01/11/2016 Document Reviewed: 09/09/2014 Elsevier Interactive Patient Education  2017 [employer].

## 2023-04-24 NOTE — Progress Notes (Signed)
Subjective:   Yvette [name] is a 72 y.o. female who presents for Medicare Annual (Subsequent) preventive examination.  Visit Complete: Virtual  I connected with  Shavawn Gautney on 04/24/23 by a audio enabled telemedicine application and verified that I am speaking with the correct person using two identifiers.  Patient Location: Home  Provider Location: Office/Clinic  I discussed the limitations of evaluation and management by telemedicine. The patient expressed understanding and agreed to proceed.   Review of Systems     Cardiac Risk Factors include: advanced age (>39men, >24 women);dyslipidemia;hypertension;obesity (BMI >30kg/m2)     Objective:    Vital Signs: Unable to obtain new vitals due to this being a telehealth visit.      04/24/2023    9:47 AM 04/16/2022    1:23 PM 04/10/2021    1:05 PM 09/26/2014    4:54 PM  Advanced Directives  Does Patient Have a Medical Advance Directive? No No No No  Does patient want to make changes to medical advance directive?    Yes - information given  Would patient like information on creating a medical advance directive? No - Patient declined No - Patient declined Yes (MAU/Ambulatory/Procedural Areas - Information given)     Current Medications (verified) Outpatient Encounter Medications as of 04/24/2023  Medication Sig   alendronate (FOSAMAX) 70 MG tablet TAKE 1 TABLET(70 MG) BY MOUTH EVERY 7 DAYS WITH A FULL GLASS OF WATER AND ON AN EMPTY STOMACH (Patient taking differently: Take 70 mg by mouth.)   amLODipine (NORVASC) 10 MG tablet Take 1 tablet (10 mg total) by mouth daily.   benazepril (LOTENSIN) 20 MG tablet Take 1 tablet (20 mg total) by mouth 2 (two) times daily.   diclofenac sodium (VOLTAREN) 1 % GEL Apply 4 g topically 4 (four) times daily.   ezetimibe (ZETIA) 10 MG tablet Take 1 tablet (10 mg total) by mouth daily.   fluticasone (FLONASE) 50 MCG/ACT nasal spray Place 1 spray into both nostrils daily.   metoprolol succinate  (TOPROL-XL) 25 MG 24 hr tablet TAKE 1 TABLET(25 MG) BY MOUTH DAILY (Patient taking differently: Take 25 mg by mouth daily.)   metoprolol tartrate (LOPRESSOR) 100 MG tablet Take one tablet 2 hours before cardiac CT for heart greater than 55   omeprazole (PRILOSEC) 20 MG capsule Take 1 capsule (20 mg total) by mouth daily.   No facility-administered encounter medications on file as of 04/24/2023.    Allergies (verified) Patient has no known allergies.   History: Past Medical History:  Diagnosis Date   GERD (gastroesophageal reflux disease)    Hypertension    Past Surgical History:  Procedure Laterality Date   ABDOMINAL HYSTERECTOMY     total   BREAST BIOPSY Right    knee surgery Bilateral    TONSILLECTOMY     Family History  Problem Relation Age of Onset   Alcohol abuse Mother    Cirrhosis Mother    Heart disease Father    Parkinson's disease Brother    Social History   Socioeconomic History   Marital status: Divorced    Spouse name: Not on file   Number of children: Not on file   Years of education: Not on file   Highest education level: Not on file  Occupational History   Not on file  Tobacco Use   Smoking status: Never   Smokeless tobacco: Never  Substance and Sexual Activity   Alcohol use: Yes    Comment: once every 2 months-cocktail at night or  wine   Drug use: No   Sexual activity: Not on file  Other Topics Concern   Not on file  Social History Narrative   Not on file   Social Determinants of Health   Financial Resource Strain: Low Risk  (04/24/2023)   Overall Financial Resource Strain (CARDIA)    Difficulty of Paying Living Expenses: Not hard at all  Food Insecurity: No Food Insecurity (04/24/2023)   Hunger Vital Sign    Worried About Running Out of Food in the Last Year: Never true    Ran Out of Food in the Last Year: Never true  Transportation Needs: No Transportation Needs (04/24/2023)   PRAPARE - Administrator, Civil Service (Medical): No     Lack of Transportation (Non-Medical): No  Physical Activity: Inactive (04/24/2023)   Exercise Vital Sign    Days of Exercise per Week: 0 days    Minutes of Exercise per Session: 0 min  Stress: No Stress Concern Present (04/24/2023)   [employer] of Occupational Health - Occupational Stress Questionnaire    Feeling of Stress : Only a little  Social Connections: Moderately Isolated (04/24/2023)   Social Connection and Isolation Panel [NHANES]    Frequency of Communication with Friends and Family: More than three times a week    Frequency of Social Gatherings with Friends and Family: More than three times a week    Attends Religious Services: Never    Database administrator or Organizations: Yes    Attends Engineer, structural: More than 4 times per year    Marital Status: Divorced    Tobacco Counseling Counseling given: Not Answered   Clinical Intake:  Pre-visit preparation completed: Yes  Pain : No/denies pain  Nutritional Risks: None Diabetes: No  How often do you need to have someone help you when you read instructions, pamphlets, or other written materials from your doctor or pharmacy?: 1 - Never  Interpreter Needed?: No  Information entered by :: [employer], CMA   Activities of Daily Living    04/24/2023    9:41 AM  In your present state of health, do you have any difficulty performing the following activities:  Hearing? 1  Comment slight difficulty hearing out of right ear  Vision? 0  Difficulty concentrating or making decisions? 0  Walking or climbing stairs? 0  Dressing or bathing? 0  Doing errands, shopping? 0  Preparing Food and eating ? N  Using the Toilet? N  In the past six months, have you accidently leaked urine? Y  Do you have problems with loss of bowel control? N  Managing your Medications? N  Managing your Finances? N  Housekeeping or managing your Housekeeping? N    Patient Care Team: Copland, Gwenlyn Found, MD as PCP - General  (Family Medicine) Janalyn Harder, MD (Inactive) as Consulting Physician (Dermatology)  Indicate any recent Medical Services you may have received from other than [facility] providers in the past year (date may be approximate).     Assessment:   This is a routine wellness examination for [name].  Hearing/Vision screen No results found.   Goals Addressed   None    Depression Screen    04/24/2023    9:51 AM 11/25/2022   10:15 AM 04/16/2022    1:17 PM 11/19/2021    9:02 AM 04/10/2021    1:11 PM 11/29/2019   10:57 AM 11/24/2015    9:51 AM  PHQ 2/9 Scores  PHQ - 2 Score 0  0 0 0 0 0 1    Fall Risk    04/24/2023    9:46 AM 11/25/2022   10:15 AM 04/16/2022    1:16 PM 11/19/2021    9:02 AM 04/10/2021    1:10 PM  Fall Risk   Falls in the past year? 1 0 0 0 1  Comment tripped over something in the yard      Number falls in past yr: 0 0 0 0 1  Injury with Fall? 0 0 0 0 0  Risk for fall due to : No Fall Risks No Fall Risks   History of fall(s)  Follow up Falls evaluation completed Falls evaluation completed Falls evaluation completed;Falls prevention discussed  Falls prevention discussed    MEDICARE RISK AT HOME: Medicare Risk at Home Any stairs in or around the home?: Yes If so, are there any without handrails?: Yes Home free of loose throw rugs in walkways, pet beds, electrical cords, etc?: Yes Adequate lighting in your home to reduce risk of falls?: Yes Life alert?: No Use of a cane, walker or w/c?: No Grab bars in the bathroom?: No Shower chair or bench in shower?: Yes Elevated toilet seat or a handicapped toilet?: No  TIMED UP AND GO:  Was the test performed?  No    Cognitive Function:        04/24/2023    9:52 AM 04/16/2022    1:34 PM  6CIT Screen  What Year? 0 points 0 points  What month? 0 points 0 points  What time? 0 points 0 points  Count back from 20 0 points 0 points  Months in reverse 2 points 0 points  Repeat phrase 0 points 0 points  Total Score 2 points 0 points     Immunizations Immunization History  Administered Date(s) Administered   Fluad Quad(high Dose 65+) 05/21/2021   Influenza Split 05/19/2022   Influenza, High Dose Seasonal PF 05/31/2016   Influenza,inj,Quad PF,6+ Mos 05/26/2015   Influenza-Unspecified 05/31/2020   PFIZER(Purple Top)SARS-COV-2 Vaccination 10/15/2019, 11/16/2019, 05/31/2020, 01/04/2021   Pneumococcal Conjugate-13 11/24/2015   Pneumococcal Polysaccharide-23 11/08/2020   Tdap 02/17/2013   Unspecified SARS-COV-2 Vaccination 05/19/2022   Zoster Recombinant(Shingrix) 05/19/2022   Zoster, Live 03/03/2015    TDAP status: Due, Education has been provided regarding the importance of this vaccine. Advised may receive this vaccine at local pharmacy or Health Dept. Aware to provide a copy of the vaccination record if obtained from local pharmacy or Health Dept. Verbalized acceptance and understanding.  Flu Vaccine status: Due, Education has been provided regarding the importance of this vaccine. Advised may receive this vaccine at local pharmacy or Health Dept. Aware to provide a copy of the vaccination record if obtained from local pharmacy or Health Dept. Verbalized acceptance and understanding.  Pneumococcal vaccine status: Up to date  Covid-19 vaccine status: Information provided on how to obtain vaccines.   Qualifies for Shingles Vaccine? Yes   Zostavax completed Yes   Shingrix Completed?: No.    Education has been provided regarding the importance of this vaccine. Patient has been advised to call insurance company to determine out of pocket expense if they have not yet received this vaccine. Advised may also receive vaccine at local pharmacy or Health Dept. Verbalized acceptance and understanding.  Screening Tests Health Maintenance  Topic Date Due   Zoster Vaccines- Shingrix (2 of 2) 07/14/2022   DTaP/Tdap/Td (2 - Td or Tdap) 02/18/2023   Medicare Annual Wellness (AWV)  04/17/2023   COVID-19 Vaccine (  6 - 2023-24  season) 04/20/2023   INFLUENZA VACCINE  11/17/2023 (Originally 03/20/2023)   MAMMOGRAM  11/17/2024   Colonoscopy  04/15/2027   Pneumonia Vaccine 48+ Years old  Completed   DEXA SCAN  Completed   Hepatitis C Screening  Completed   HPV VACCINES  Aged Out    Health Maintenance  Health Maintenance Due  Topic Date Due   Zoster Vaccines- Shingrix (2 of 2) 07/14/2022   DTaP/Tdap/Td (2 - Td or Tdap) 02/18/2023   Medicare Annual Wellness (AWV)  04/17/2023   COVID-19 Vaccine (6 - 2023-24 season) 04/20/2023    Colorectal cancer screening: Type of screening: Colonoscopy. Completed 04/14/17. Repeat every 10 years  Mammogram status: Completed 11/18/22. Repeat every year  Bone Density status: Completed 12/03/22. Results reflect: Bone density results: OSTEOPENIA. Repeat every 2 years.  Lung Cancer Screening: (Low Dose CT Chest recommended if Age 44-80 years, 20 pack-year currently smoking OR have quit w/in 15years.) does not qualify.   Additional Screening:  Hepatitis C Screening: does qualify; Completed 11/24/15  Vision Screening: Recommended annual ophthalmology exams for early detection of glaucoma and other disorders of the eye. Is the patient up to date with their annual eye exam?  Yes  Who is the provider or what is the name of the office in which the patient attends annual eye exams? Can't remember name of new doctor  If pt is not established with a provider, would they like to be referred to a provider to establish care? No .   Dental Screening: Recommended annual dental exams for proper oral hygiene  Diabetic Foot Exam: N/a  Community Resource Referral / Chronic Care Management: CRR required this visit?  No   CCM required this visit?  No     Plan:     I have personally reviewed and noted the following in the patient's chart:   Medical and social history Use of alcohol, tobacco or illicit drugs  Current medications and supplements including opioid prescriptions. Patient is not  currently taking opioid prescriptions. Functional ability and status Nutritional status Physical activity Advanced directives List of other physicians Hospitalizations, surgeries, and ER visits in previous 12 months Vitals Screenings to include cognitive, depression, and falls Referrals and appointments  In addition, I have reviewed and discussed with patient certain preventive protocols, quality metrics, and best practice recommendations. A written personalized care plan for preventive services as well as general preventive health recommendations were provided to patient.     Donne Anon, CMA   04/24/2023   After Visit Summary: (MyChart) Due to this being a telephonic visit, the after visit summary with patients personalized plan was offered to patient via MyChart   Nurse Notes: None

## 2023-05-07 ENCOUNTER — Other Ambulatory Visit: Payer: Self-pay | Admitting: Family Medicine

## 2023-05-19 ENCOUNTER — Ambulatory Visit (HOSPITAL_BASED_OUTPATIENT_CLINIC_OR_DEPARTMENT_OTHER)
Admission: RE | Admit: 2023-05-19 | Discharge: 2023-05-19 | Disposition: A | Discharge status: Home / Self Care | Payer: Medicare PPO | Source: Ambulatory Visit | Attending: Family Medicine | Admitting: Family Medicine

## 2023-05-19 DIAGNOSIS — R911 Solitary pulmonary nodule: Secondary | ICD-10-CM

## 2023-05-19 DIAGNOSIS — R918 Other nonspecific abnormal finding of lung field: Secondary | ICD-10-CM | POA: Diagnosis not present

## 2023-05-19 DIAGNOSIS — I700 Atherosclerosis of aorta: Secondary | ICD-10-CM | POA: Diagnosis not present

## 2023-05-19 NOTE — Progress Notes (Signed)
[facility] [address], [address] [place], [place] [phone]  Date:  05/26/2023   Name:  Yvette [name]   DOB:  January 29, 1951   MRN:  [id number]  PCP:  Yvette Cables, MD    Chief Complaint: 6 month follow up (Concerns/ questions: 1. Discuss recent imaging. 2. Pt needs refills. /Shingrix, Td due- Medicare pt/Flu shot today: yes)   History of Present Illness:  Yvette [name] is a 72 y.o. very pleasant female patient who presents with the following:  Patient seen today for periodic follow-up Most recent visit with myself was in April  History of hypertension, hyperlipidemia, GERD, obesity, osteoporosis At her most recent visit we did x-rays for chronic low back pain  She is a widow, her husband died a few years ago She has 2 adult children She is living with her daughter Yvette [name]- she is doing well, her grands are doing well, they love school this year  She needs second dose of Shingrix, she plans to do this ASAP May need a tetanus booster- she is not sure, but most likely 10 years ago she got a tdap for a grandchild Flu vaccine- give today  Recommend COVID booster DEXA scan up-to-date Mammogram, colonoscopy up-to-date  We did complete labs in April, she also had a BMP in July Patient Active Problem List   Diagnosis Date Noted   Coronary artery calcification calcium score 489 which is 90% percentile 02/17/2023   Dyspnea on exertion 02/17/2023   Screen for colon cancer 02/12/2023   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/2023   Primary osteoarthritis of left hip 01/21/2023   Lumbar spondylosis 01/21/2023   Lumbar degenerative disc disease 01/21/2023   Posterior tibial tendonitis, right 06/05/2022   Pes planus of right foot 06/05/2022   Accessory navicular bone of right foot 06/05/2022   ETD (Eustachian tube dysfunction), right 03/13/2022   Osteoporosis 10/27/2020    Hyperlipidemia with target LDL less than 100 05/26/2015   Morbid obesity (HCC) 03/16/2015   Hypertension 02/17/2013   GERD (gastroesophageal reflux disease) 02/17/2013    Past Medical History:  Diagnosis Date   GERD (gastroesophageal reflux disease)    Hypertension     Past Surgical History:  Procedure Laterality Date   ABDOMINAL HYSTERECTOMY     total   BREAST BIOPSY Right    knee surgery Bilateral    TONSILLECTOMY      Social History   Tobacco Use   Smoking status: Never   Smokeless tobacco: Never  Substance Use Topics   Alcohol use: Yes    Comment: once every 2 months-cocktail at night or wine   Drug use: No    Family History  Problem Relation Age of Onset   Alcohol abuse Mother    Cirrhosis Mother    Heart disease Father    Parkinson's disease Brother     No Known Allergies  Medication list has been reviewed and updated.  Current Outpatient Medications on File Prior to Visit  Medication Sig Dispense Refill   alendronate (FOSAMAX) 70 MG tablet TAKE 1 TABLET(70 MG) BY MOUTH EVERY 7 DAYS WITH A FULL GLASS OF WATER AND ON AN EMPTY STOMACH (Patient taking differently: Take 70 mg by mouth.) 12 tablet 3   amLODipine (NORVASC) 10 MG tablet Take 1 tablet (10 mg total) by mouth daily. 90 tablet 0   benazepril (LOTENSIN) 20 MG tablet Take 1 tablet (20 mg total) by mouth  2 (two) times daily. 180 tablet 0   diclofenac sodium (VOLTAREN) 1 % GEL Apply 4 g topically 4 (four) times daily. 500 g 1   fluticasone (FLONASE) 50 MCG/ACT nasal spray Place 1 spray into both nostrils daily. 16 g 11   metoprolol succinate (TOPROL-XL) 25 MG 24 hr tablet TAKE 1 TABLET(25 MG) BY MOUTH DAILY 90 tablet 1   omeprazole (PRILOSEC) 20 MG capsule Take 1 capsule (20 mg total) by mouth daily. 90 capsule 0   ezetimibe (ZETIA) 10 MG tablet Take 1 tablet (10 mg total) by mouth daily. 90 tablet 3   No current facility-administered medications on file prior to visit.    Review of Systems:  As per  HPI- otherwise negative.   Physical Examination: Vitals:   05/26/23 1018  BP: 132/70  Pulse: 61  Resp: 18  Temp: 98.4 F (36.9 C)  SpO2: 98%   Vitals:   05/26/23 1018  Weight: 220 lb (99.8 kg)  Height: 5\' 3"  (1.6 m)   Body mass index is 38.97 kg/m. Ideal Body Weight: Weight in (lb) to have BMI = 25: 140.8  GEN: no acute distress. Obese, looks well  HEENT: Atraumatic, Normocephalic.  Bilateral TM wnl, oropharynx normal.  PEERL,EOMI. minimal redness of the right external auditory canal Ears and Nose: No external deformity. CV: RRR, No M/G/R. No JVD. No thrill. No extra heart sounds. PULM: CTA B, no wheezes, crackles, rhonchi. No retractions. No resp. distress. No accessory muscle use. ABD: S, NT, ND, +BS. No rebound. No HSM. EXTR: No c/c/e PSYCH: Normally interactive. Conversant.    Assessment and Plan: Hyperlipidemia, unspecified hyperlipidemia type  Elevated coronary artery calcium score  Essential hypertension - Plan: amLODipine (NORVASC) 10 MG tablet, benazepril (LOTENSIN) 20 MG tablet  Statin myopathy  Immunization due - Plan: Flu Vaccine Trivalent High Dose (Fluad)  Gastroesophageal reflux disease without esophagitis - Plan: omeprazole (PRILOSEC) 20 MG capsule  Ear fullness, right - Plan: acetic acid-hydrocortisone (VOSOL-HC) OTIC solution  Patient seen today for follow-up. We found a high coronary calcium score last year, unfortunately she does have statin myopathy and as such is using nonstatin medication for cholesterol control Blood pressure well-controlled Gave flu shot today, recommended other indicated immunizations She sometimes will note a feeling of fullness in her right ear, gave her some acetic acid hydrocortisone drops to try Signed Abbe Amsterdam, MD

## 2023-05-26 ENCOUNTER — Ambulatory Visit (INDEPENDENT_AMBULATORY_CARE_PROVIDER_SITE_OTHER): Payer: Medicare PPO | Admitting: Family Medicine

## 2023-05-26 VITALS — BP 132/70 | HR 61 | Temp 98.4°F | Resp 18 | Ht 63.0 in | Wt 220.0 lb

## 2023-05-26 DIAGNOSIS — Z23 Encounter for immunization: Secondary | ICD-10-CM | POA: Diagnosis not present

## 2023-05-26 DIAGNOSIS — E785 Hyperlipidemia, unspecified: Secondary | ICD-10-CM

## 2023-05-26 DIAGNOSIS — G720 Drug-induced myopathy: Secondary | ICD-10-CM | POA: Diagnosis not present

## 2023-05-26 DIAGNOSIS — K219 Gastro-esophageal reflux disease without esophagitis: Secondary | ICD-10-CM

## 2023-05-26 DIAGNOSIS — T466X5A Adverse effect of antihyperlipidemic and antiarteriosclerotic drugs, initial encounter: Secondary | ICD-10-CM

## 2023-05-26 DIAGNOSIS — R931 Abnormal findings on diagnostic imaging of heart and coronary circulation: Secondary | ICD-10-CM

## 2023-05-26 DIAGNOSIS — I10 Essential (primary) hypertension: Secondary | ICD-10-CM

## 2023-05-26 DIAGNOSIS — H938X1 Other specified disorders of right ear: Secondary | ICD-10-CM

## 2023-05-26 MED ORDER — HYDROCORTISONE-ACETIC ACID 1-2 % OT SOLN: 3.0000 [drp] | Freq: Three times a day (TID) | OTIC | 0 refills | Status: DC

## 2023-05-26 MED ORDER — OMEPRAZOLE 20 MG PO CPDR
20.0000 mg | DELAYED_RELEASE_CAPSULE | Freq: Every day | ORAL | 3 refills | Status: DC
Start: 2023-05-26 — End: 2024-05-04

## 2023-05-26 MED ORDER — AMLODIPINE BESYLATE 10 MG PO TABS
10.0000 mg | ORAL_TABLET | Freq: Every day | ORAL | 3 refills | Status: DC
Start: 2023-05-26 — End: 2024-05-04

## 2023-05-26 MED ORDER — BENAZEPRIL HCL 20 MG PO TABS
20.0000 mg | ORAL_TABLET | Freq: Two times a day (BID) | ORAL | 3 refills | Status: DC
Start: 2023-05-26 — End: 2024-05-04

## 2023-05-26 NOTE — Patient Instructions (Signed)
Good to see you again today estimation point  Assuming all is well, please see me for your physical in April.  Flu shot given today, I would recommend getting a COVID booster this fall, second dose of shingles vaccine, and a tetanus booster - can get all at your pharmacy  Please let me know if any concerns in the meantime

## 2023-05-29 ENCOUNTER — Encounter: Payer: Self-pay | Admitting: Family Medicine

## 2023-05-29 DIAGNOSIS — R918 Other nonspecific abnormal finding of lung field: Secondary | ICD-10-CM

## 2023-09-11 ENCOUNTER — Other Ambulatory Visit: Payer: Self-pay | Admitting: Family Medicine

## 2023-09-11 DIAGNOSIS — M810 Age-related osteoporosis without current pathological fracture: Secondary | ICD-10-CM

## 2023-10-03 ENCOUNTER — Other Ambulatory Visit: Payer: Self-pay | Admitting: Family Medicine

## 2023-10-03 DIAGNOSIS — J309 Allergic rhinitis, unspecified: Secondary | ICD-10-CM

## 2023-10-03 MED ORDER — FLUTICASONE PROPIONATE 50 MCG/ACT NA SUSP
1.0000 | Freq: Every day | NASAL | 5 refills | Status: AC
Start: 2023-10-03 — End: ?

## 2023-10-03 NOTE — Telephone Encounter (Signed)
Copied from CRM [id number]. Topic: Clinical - Medication Refill >> Oct 03, 2023 11:56 AM Kathryne Eriksson wrote: Most Recent Primary Care Visit:  Provider: Pearline Cables  Department: [facility]  Visit Type: OFFICE VISIT  Date: 05/26/2023  Medication: fluticasone (FLONASE) 50 MCG/ACT nasal spray  Has the patient contacted their pharmacy? Yes (Agent: If no, request that the patient contact the pharmacy for the refill. If patient does not wish to contact the pharmacy document the reason why and proceed with request.) (Agent: If yes, when and what did the pharmacy advise?)  Is this the correct pharmacy for this prescription? Yes If no, delete pharmacy and type the correct one.  This is the patient's preferred pharmacy:  [id number] [place], [place] - [address] AT [facility] OF [facility] & [address] [facility] [place] [address] Phone: [phone] Fax: [phone]   Has the prescription been filled recently? No  Is the patient out of the medication? No  Has the patient been seen for an appointment in the last year OR does the patient have an upcoming appointment? Yes  Can we respond through MyChart? Yes  Agent: Please be advised that Rx refills may take up to 3 business days. We ask that you follow-up with your pharmacy.

## 2023-11-06 ENCOUNTER — Other Ambulatory Visit: Payer: Self-pay | Admitting: Family Medicine

## 2023-11-19 NOTE — Progress Notes (Signed)
 [facility] [address], [address] [place], [place] [address] [phone]  Date:  11/24/2023   Name:  Yvette [name]   DOB:  1951/01/28   MRN:  [id number]  PCP:  Pearline Cables, MD    Chief Complaint: 6 month follow up (Concerns/ questions: R shoulder pain d/t shoveling /)   History of Present Illness:  Yvette [name] is a 73 y.o. very pleasant female patient who presents with the following:  Patient seen today for periodic follow-up Most recent visit with myself was in October History of hypertension, hyperlipidemia, GERD, obesity, osteoporosis  Yvette [name] lost her husband a few years ago, she now lives with her daughter Yvette [name].  She has 2 children and her grands are 68 and 71 yo this year  She has been shoveling some compost and hurt her right shoulder but this is getting better with rest   Second dose of Shingrix-pt notes this was done  Tetanus booster- this is UTD Recommend COVID booster Flu is up-to-date Mammogram due this month- she will schedule  DEXA scan is current-osteopenia Colon cancer screening up-to-date We can update labs this month  Fosamax Amlodipine Benazepril 20 twice daily Zetia 10 Toprol-XL 25 Prilosec  She has seen cardiology for asymptomatic CAD- she did a coronary morphology last year with score of 90% percentile Not able to tolerate statin- she is using zetia  Patient Active Problem List   Diagnosis Date Noted   Coronary artery calcification calcium score 489 which is 90% percentile 02/17/2023   Dyspnea on exertion 02/17/2023   Screen for colon cancer 02/12/2023   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/2023   Primary osteoarthritis of left hip 01/21/2023   Lumbar spondylosis 01/21/2023   Lumbar degenerative disc disease 01/21/2023   Posterior tibial tendonitis, right 06/05/2022   Pes planus of right foot 06/05/2022   Accessory navicular bone of right foot  06/05/2022   ETD (Eustachian tube dysfunction), right 03/13/2022   Osteoporosis 10/27/2020   Hyperlipidemia with target LDL less than 100 05/26/2015   Morbid obesity (HCC) 03/16/2015   Hypertension 02/17/2013   GERD (gastroesophageal reflux disease) 02/17/2013    Past Medical History:  Diagnosis Date   GERD (gastroesophageal reflux disease)    Hypertension     Past Surgical History:  Procedure Laterality Date   ABDOMINAL HYSTERECTOMY     total   BREAST BIOPSY Right    knee surgery Bilateral    TONSILLECTOMY      Social History   Tobacco Use   Smoking status: Never   Smokeless tobacco: Never  Substance Use Topics   Alcohol use: Yes    Comment: once every 2 months-cocktail at night or wine   Drug use: No    Family History  Problem Relation Age of Onset   Alcohol abuse Mother    Cirrhosis Mother    Heart disease Father    Parkinson's disease Brother     No Known Allergies  Medication list has been reviewed and updated.  Current Outpatient Medications on File Prior to Visit  Medication Sig Dispense Refill   acetic acid-hydrocortisone (VOSOL-HC) OTIC solution Place 3 drops into the right ear 3 (three) times daily. Use as needed for pain or itching 10 mL 0   alendronate (FOSAMAX) 70 MG tablet TAKE 1 TABLET(70 MG) BY MOUTH EVERY 7 DAYS WITH A FULL GLASS OF WATER AND ON AN EMPTY STOMACH 12 tablet 3   amLODipine (  NORVASC) 10 MG tablet Take 1 tablet (10 mg total) by mouth daily. 90 tablet 3   benazepril (LOTENSIN) 20 MG tablet Take 1 tablet (20 mg total) by mouth 2 (two) times daily. 180 tablet 3   diclofenac sodium (VOLTAREN) 1 % GEL Apply 4 g topically 4 (four) times daily. 500 g 1   fluticasone (FLONASE) 50 MCG/ACT nasal spray Place 1 spray into both nostrils daily. 16 g 5   omeprazole (PRILOSEC) 20 MG capsule Take 1 capsule (20 mg total) by mouth daily. 90 capsule 3   ezetimibe (ZETIA) 10 MG tablet Take 1 tablet (10 mg total) by mouth daily. 90 tablet 3   No  current facility-administered medications on file prior to visit.    Review of Systems:  As per HPI- otherwise negative.   Physical Examination: Vitals:   11/24/23 1035  BP: 132/72  Pulse: 67  Resp: 18  Temp: 98.2 F (36.8 C)  SpO2: 97%   Vitals:   11/24/23 1035  Weight: 222 lb (100.7 kg)  Height: 5\' 3"  (1.6 m)   Body mass index is 39.33 kg/m. Ideal Body Weight: Weight in (lb) to have BMI = 25: 140.8  GEN: no acute distress. Mild obesity, looks well  HEENT: Atraumatic, Normocephalic.  Ears and Nose: No external deformity. CV: RRR, No M/G/R. No JVD. No thrill. No extra heart sounds. PULM: CTA B, no wheezes, crackles, rhonchi. No retractions. No resp. distress. No accessory muscle use. ABD: S, NT, ND, +BS. No rebound. No HSM. EXTR: No c/c/e PSYCH: Normally interactive. Conversant.    Assessment and Plan: Hyperlipidemia, unspecified hyperlipidemia type - Plan: Lipid panel  Essential hypertension - Plan: CBC, Comprehensive metabolic panel with GFR, metoprolol succinate (TOPROL-XL) 25 MG 24 hr tablet  Statin myopathy  Elevated coronary artery calcium score  Gastroesophageal reflux disease without esophagitis  Screening for diabetes mellitus - Plan: Hemoglobin A1c  Thyroid disorder screening - Plan: TSH  Will check on her lipids today- she may need a PCSK9 if not doing well on the zetia/ I can get in touch with Dr Kirtland Bouchard as needed BP well controlled IUTD except for RSV Will plan further follow- up pending labs.   Signed Abbe Amsterdam, MD  Received her labs as below, message to patient  Results for orders placed or performed in visit on 11/24/23  CBC   Collection Time: 11/24/23 11:04 AM  Result Value Ref Range   WBC 5.6 4.0 - 10.5 K/uL   RBC 4.00 3.87 - 5.11 Mil/uL   Platelets 319.0 150.0 - 400.0 K/uL   Hemoglobin 12.5 12.0 - 15.0 g/dL   HCT 53.6 64.4 - 03.4 %   MCV 93.2 78.0 - 100.0 fl   MCHC 33.5 30.0 - 36.0 g/dL   RDW 74.2 59.5 - 63.8 %   Comprehensive metabolic panel with GFR   Collection Time: 11/24/23 11:04 AM  Result Value Ref Range   Sodium 140 135 - 145 mEq/L   Potassium 4.4 3.5 - 5.1 mEq/L   Chloride 106 96 - 112 mEq/L   CO2 26 19 - 32 mEq/L   Glucose, Bld 95 70 - 99 mg/dL   BUN 14 6 - 23 mg/dL   Creatinine, Ser 7.56 0.40 - 1.20 mg/dL   Total Bilirubin 0.4 0.2 - 1.2 mg/dL   Alkaline Phosphatase 56 39 - 117 U/L   AST 13 0 - 37 U/L   ALT 14 0 - 35 U/L   Total Protein 6.9 6.0 - 8.3 g/dL  Albumin 4.6 3.5 - 5.2 g/dL   GFR 46.96 >29.52 mL/min   Calcium 9.9 8.4 - 10.5 mg/dL  Hemoglobin W4X   Collection Time: 11/24/23 11:04 AM  Result Value Ref Range   Hgb A1c MFr Bld 5.7 4.6 - 6.5 %  Lipid panel   Collection Time: 11/24/23 11:04 AM  Result Value Ref Range   Cholesterol 200 0 - 200 mg/dL   Triglycerides 324.4 (H) 0.0 - 149.0 mg/dL   HDL 01.02 >72.53 mg/dL   VLDL 66.4 0.0 - 40.3 mg/dL   LDL Cholesterol 474 (H) 0 - 99 mg/dL   Total CHOL/HDL Ratio 4    NonHDL 153.98   TSH   Collection Time: 11/24/23 11:04 AM  Result Value Ref Range   TSH 2.85 0.35 - 5.50 uIU/mL

## 2023-11-19 NOTE — Patient Instructions (Addendum)
 It was great to see you again today!  I will be in touch with your labs  Your shots are all up to date except you might want to get a dose of RSV!   Assuming all is well please see me in about 6 months

## 2023-11-24 ENCOUNTER — Encounter: Payer: Self-pay | Admitting: Family Medicine

## 2023-11-24 ENCOUNTER — Ambulatory Visit (INDEPENDENT_AMBULATORY_CARE_PROVIDER_SITE_OTHER): Payer: Medicare PPO | Admitting: Family Medicine

## 2023-11-24 VITALS — BP 132/72 | HR 67 | Temp 98.2°F | Resp 18 | Ht 63.0 in | Wt 222.0 lb

## 2023-11-24 DIAGNOSIS — Z1329 Encounter for screening for other suspected endocrine disorder: Secondary | ICD-10-CM

## 2023-11-24 DIAGNOSIS — K219 Gastro-esophageal reflux disease without esophagitis: Secondary | ICD-10-CM

## 2023-11-24 DIAGNOSIS — Z131 Encounter for screening for diabetes mellitus: Secondary | ICD-10-CM

## 2023-11-24 DIAGNOSIS — G720 Drug-induced myopathy: Secondary | ICD-10-CM | POA: Diagnosis not present

## 2023-11-24 DIAGNOSIS — R931 Abnormal findings on diagnostic imaging of heart and coronary circulation: Secondary | ICD-10-CM | POA: Diagnosis not present

## 2023-11-24 DIAGNOSIS — I10 Essential (primary) hypertension: Secondary | ICD-10-CM | POA: Diagnosis not present

## 2023-11-24 DIAGNOSIS — E785 Hyperlipidemia, unspecified: Secondary | ICD-10-CM | POA: Diagnosis not present

## 2023-11-24 DIAGNOSIS — T466X5A Adverse effect of antihyperlipidemic and antiarteriosclerotic drugs, initial encounter: Secondary | ICD-10-CM

## 2023-11-24 LAB — CBC
HCT: 37.3 % (ref 36.0–46.0)
Hemoglobin: 12.5 g/dL (ref 12.0–15.0)
MCHC: 33.5 g/dL (ref 30.0–36.0)
MCV: 93.2 fl (ref 78.0–100.0)
Platelets: 319 10*3/uL (ref 150.0–400.0)
RBC: 4 Mil/uL (ref 3.87–5.11)
RDW: 14 % (ref 11.5–15.5)
WBC: 5.6 10*3/uL (ref 4.0–10.5)

## 2023-11-24 LAB — COMPREHENSIVE METABOLIC PANEL WITH GFR
ALT: 14 U/L (ref 0–35)
AST: 13 U/L (ref 0–37)
Albumin: 4.6 g/dL (ref 3.5–5.2)
Alkaline Phosphatase: 56 U/L (ref 39–117)
BUN: 14 mg/dL (ref 6–23)
CO2: 26 meq/L (ref 19–32)
Calcium: 9.9 mg/dL (ref 8.4–10.5)
Chloride: 106 meq/L (ref 96–112)
Creatinine, Ser: 0.72 mg/dL (ref 0.40–1.20)
GFR: 83.06 mL/min (ref >60.00)
Glucose, Bld: 95 mg/dL (ref 70–99)
Potassium: 4.4 meq/L (ref 3.5–5.1)
Sodium: 140 meq/L (ref 135–145)
Total Bilirubin: 0.4 mg/dL (ref 0.2–1.2)
Total Protein: 6.9 g/dL (ref 6.0–8.3)

## 2023-11-24 LAB — HEMOGLOBIN A1C: Hgb A1c MFr Bld: 5.7 % (ref 4.6–6.5)

## 2023-11-24 LAB — LIPID PANEL
Cholesterol: 200 mg/dL (ref 0–200)
HDL: 46.4 mg/dL (ref >39.00)
LDL Cholesterol: 115 mg/dL — ABNORMAL HIGH (ref 0–99)
NonHDL: 153.98
Total CHOL/HDL Ratio: 4
Triglycerides: 194 mg/dL — ABNORMAL HIGH (ref 0.0–149.0)
VLDL: 38.8 mg/dL (ref 0.0–40.0)

## 2023-11-24 LAB — TSH: TSH: 2.85 u[IU]/mL (ref 0.35–5.50)

## 2023-11-24 MED ORDER — METOPROLOL SUCCINATE ER 25 MG PO TB24
25.0000 mg | ORAL_TABLET | Freq: Every day | ORAL | 3 refills | Status: DC
Start: 2023-11-24 — End: 2024-05-26

## 2023-12-03 DIAGNOSIS — Z1231 Encounter for screening mammogram for malignant neoplasm of breast: Secondary | ICD-10-CM | POA: Diagnosis not present

## 2023-12-03 LAB — HM MAMMOGRAPHY

## 2023-12-08 ENCOUNTER — Encounter: Payer: Self-pay | Admitting: Family Medicine

## 2024-01-21 IMAGING — DX DG CERVICAL SPINE COMPLETE 4+V
6 series · 6 of 6 positions shown · non-contrast
Comparison: None.

CLINICAL DATA: Cervical spine cramping. Numbness digits 1-3
bilateral hands.

EXAM:
CERVICAL SPINE - COMPLETE 4+ VIEW

[c-spine lat]
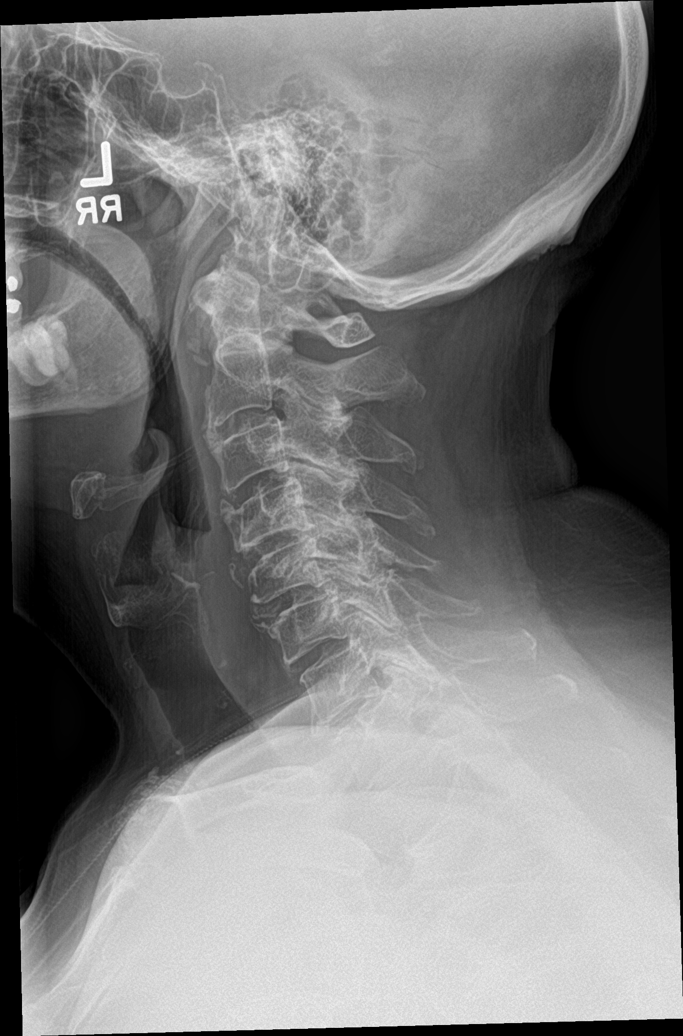

[c-spine obl (1 of 2)]
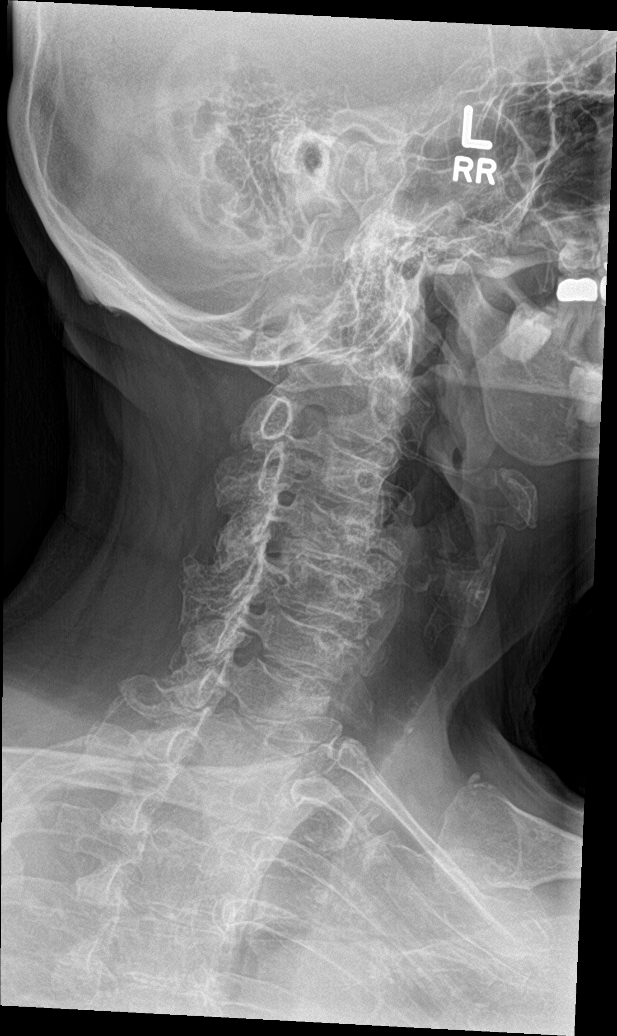

[c-spine obl (2 of 2)]
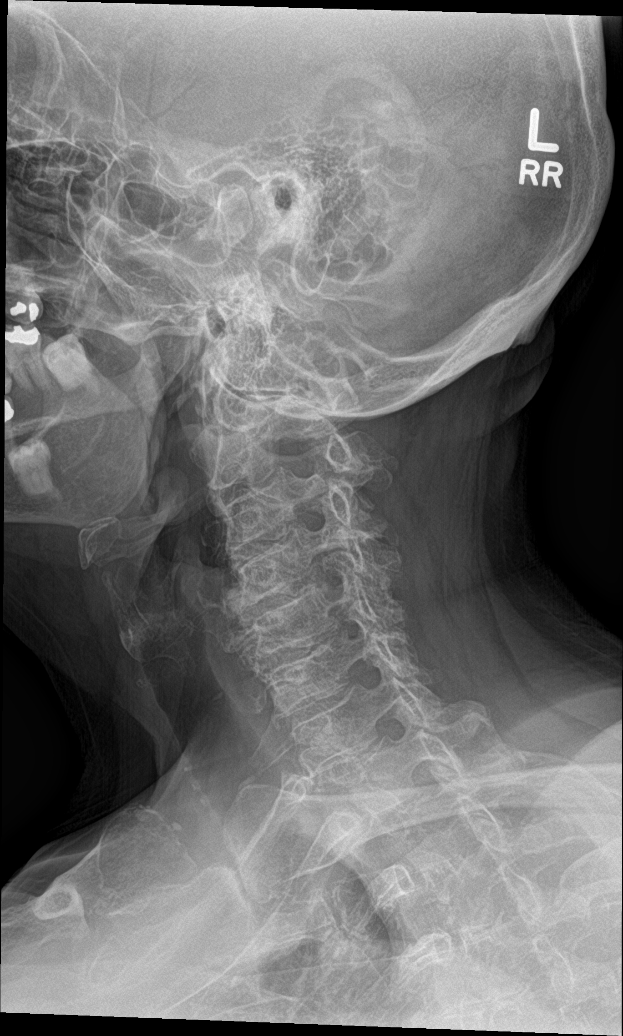

[c-spine ap]
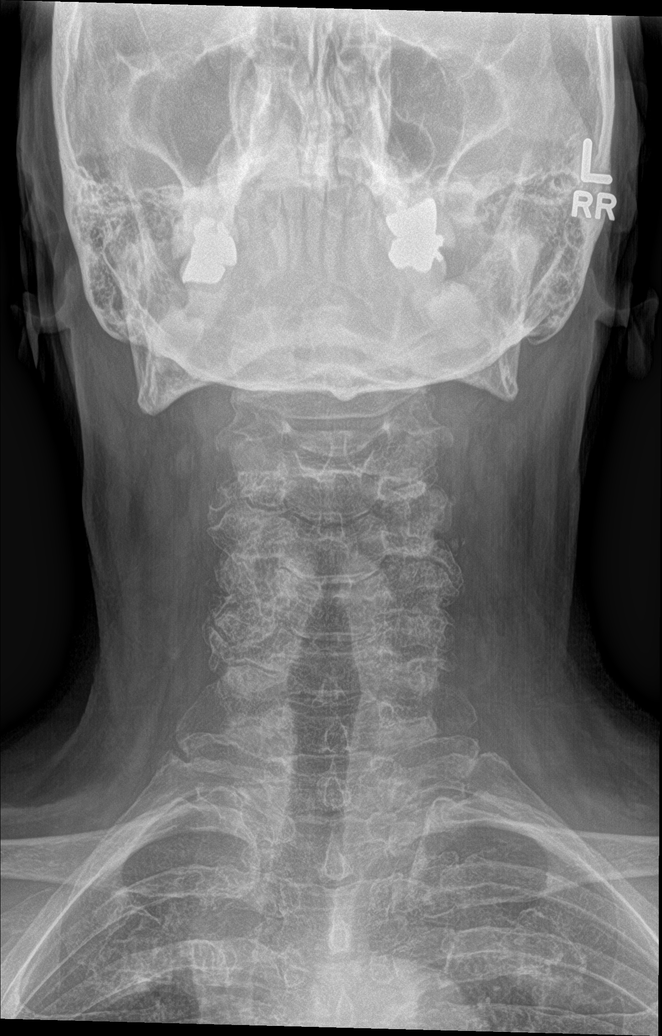

[c-spine open mouth]
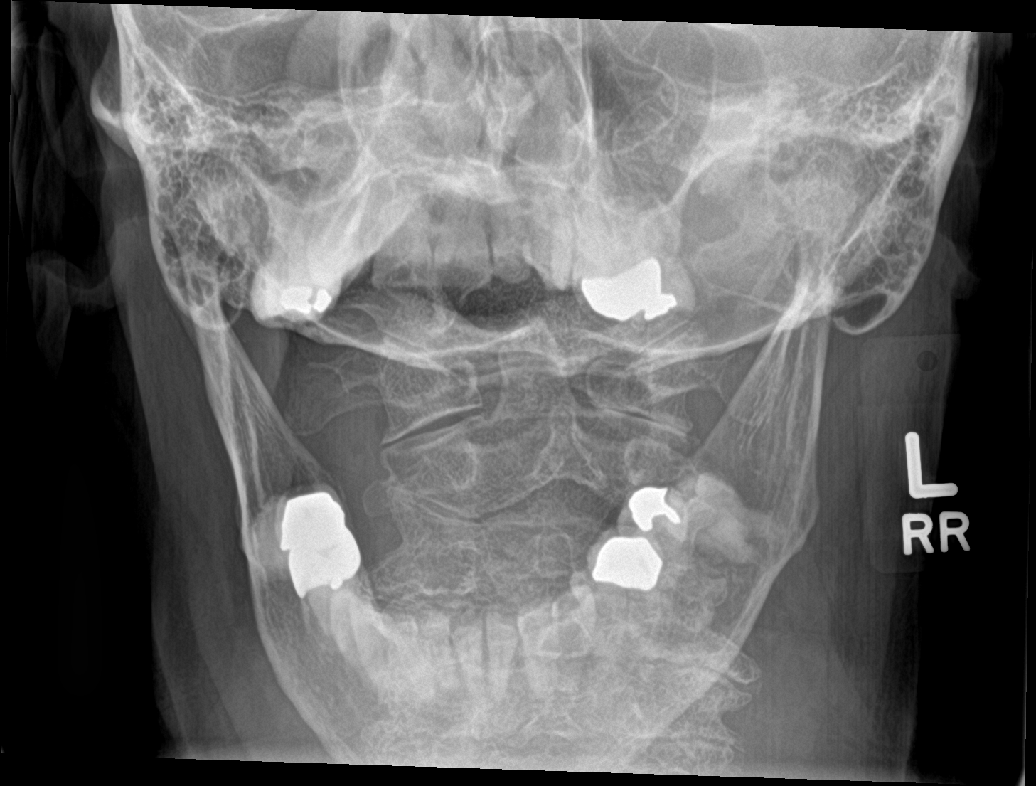

[c-spine swimmers]
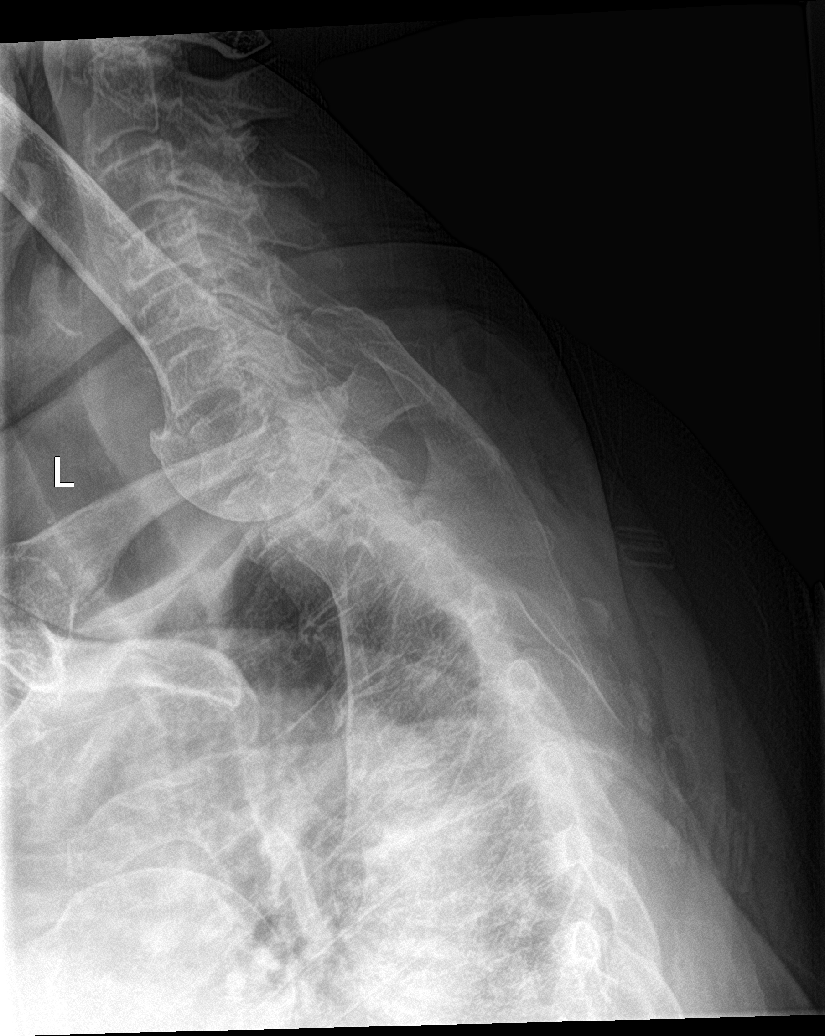

[6 of 6 positions shown; findings below may reference images not displayed]

FINDINGS: Straightening of the cervical spine. Vertebral body heights are
maintained. Diffuse degenerative changes throughout the cervical
spine with mild disc space narrowing C4 through C7. Dens and lateral
masses are within normal limits. Moderate diffuse right greater than
left foraminal narrowing.
IMPRESSION: Straightening of the cervical spine with mild to moderate diffuse
degenerative changes of the cervical spine

## 2024-01-30 DIAGNOSIS — M25572 Pain in left ankle and joints of left foot: Secondary | ICD-10-CM | POA: Diagnosis not present

## 2024-01-30 DIAGNOSIS — G8929 Other chronic pain: Secondary | ICD-10-CM | POA: Diagnosis not present

## 2024-01-30 DIAGNOSIS — M76822 Posterior tibial tendinitis, left leg: Secondary | ICD-10-CM | POA: Diagnosis not present

## 2024-01-30 DIAGNOSIS — M216X2 Other acquired deformities of left foot: Secondary | ICD-10-CM | POA: Diagnosis not present

## 2024-02-17 ENCOUNTER — Other Ambulatory Visit: Payer: Self-pay | Admitting: Cardiology

## 2024-02-24 ENCOUNTER — Telehealth: Payer: Self-pay | Admitting: Family Medicine

## 2024-02-24 NOTE — Telephone Encounter (Signed)
 Copied from CRM [id number]. Topic: Medicare AWV >> Feb 24, 2024  1:25 PM Nathanel DEL wrote: Reason for CRM: LVM 02/24/2024 to schedule AWV. Please schedule Virtual or Telehealth visits ONLY.   Nathanel Paschal; Care Guide Ambulatory Clinical Support [facility] Direct Dial: [phone]

## 2024-03-08 DIAGNOSIS — M76822 Posterior tibial tendinitis, left leg: Secondary | ICD-10-CM | POA: Diagnosis not present

## 2024-03-08 DIAGNOSIS — M216X2 Other acquired deformities of left foot: Secondary | ICD-10-CM | POA: Diagnosis not present

## 2024-03-15 ENCOUNTER — Other Ambulatory Visit: Payer: Self-pay | Admitting: Cardiology

## 2024-03-15 DIAGNOSIS — R911 Solitary pulmonary nodule: Secondary | ICD-10-CM

## 2024-03-24 DIAGNOSIS — M216X2 Other acquired deformities of left foot: Secondary | ICD-10-CM | POA: Diagnosis not present

## 2024-03-24 DIAGNOSIS — M25572 Pain in left ankle and joints of left foot: Secondary | ICD-10-CM | POA: Diagnosis not present

## 2024-03-24 DIAGNOSIS — M76822 Posterior tibial tendinitis, left leg: Secondary | ICD-10-CM | POA: Diagnosis not present

## 2024-03-25 NOTE — Telephone Encounter (Signed)
 Copied from CRM #[id number]. Topic: Clinical - Request for Lab/Test Order >> Mar 25, 2024 11:09 AM Mercedes MATSU wrote: Reason for CRM: Janella from [facility] called in stating that the patient wants to go to hospital in [place] for her CT, and not [place]. Patient needs new orders sent. Requesting call back once orders are sent.

## 2024-04-12 ENCOUNTER — Ambulatory Visit (HOSPITAL_BASED_OUTPATIENT_CLINIC_OR_DEPARTMENT_OTHER)
Admission: RE | Admit: 2024-04-12 | Discharge: 2024-04-12 | Disposition: A | Discharge status: Home / Self Care | Source: Ambulatory Visit | Attending: Family Medicine | Admitting: Family Medicine

## 2024-04-12 DIAGNOSIS — R918 Other nonspecific abnormal finding of lung field: Secondary | ICD-10-CM | POA: Diagnosis not present

## 2024-04-12 DIAGNOSIS — I700 Atherosclerosis of aorta: Secondary | ICD-10-CM | POA: Diagnosis not present

## 2024-04-26 ENCOUNTER — Ambulatory Visit (INDEPENDENT_AMBULATORY_CARE_PROVIDER_SITE_OTHER): Admitting: Family Medicine

## 2024-04-26 ENCOUNTER — Telehealth: Payer: Self-pay

## 2024-04-26 ENCOUNTER — Encounter: Payer: Self-pay | Admitting: Family Medicine

## 2024-04-26 VITALS — BP 132/72 | Ht 63.0 in | Wt 222.0 lb

## 2024-04-26 DIAGNOSIS — Z0000 Encounter for general adult medical examination without abnormal findings: Secondary | ICD-10-CM

## 2024-04-26 NOTE — Patient Instructions (Signed)
 Yvette [name],  Thank you for taking the time for your Medicare Wellness Visit. I appreciate your continued commitment to your health goals. Please review the care plan we discussed, and feel free to reach out if I can assist you further.  Medicare recommends these wellness visits once per year to help you and your care team stay ahead of potential health issues. These visits are designed to focus on prevention, allowing your provider to concentrate on managing your acute and chronic conditions during your regular appointments.  Please note that Annual Wellness Visits do not include a physical exam. Some assessments may be limited, especially if the visit was conducted virtually. If needed, we may recommend a separate in-person follow-up with your provider.  Ongoing Care Seeing your primary care provider every 3 to 6 months helps us  monitor your health and provide consistent, personalized care.   Referrals If a referral was made during today's visit and you haven't received any updates within two weeks, please contact the referred provider directly to check on the status.  Recommended Screenings:  Health Maintenance  Topic Date Due   Flu Shot  03/19/2024   COVID-19 Vaccine (7 - 2024-25 season) 04/19/2024   Medicare Annual Wellness Visit  04/23/2024   Mammogram  12/02/2025   Colon Cancer Screening  04/15/2027   DTaP/Tdap/Td vaccine (3 - Td or Tdap) 06/05/2033   Pneumococcal Vaccine for age over 99  Completed   DEXA scan (bone density measurement)  Completed   Hepatitis C Screening  Completed   Zoster (Shingles) Vaccine  Completed   HPV Vaccine  Aged Out   Meningitis B Vaccine  Aged Out       04/26/2024   11:39 AM  Advanced Directives  Does Patient Have a Medical Advance Directive? No  Would patient like information on creating a medical advance directive? No - Patient declined   Advance Care Planning is important because it: Ensures you receive medical care that aligns with your  values, goals, and preferences. Provides guidance to your family and loved ones, reducing the emotional burden of decision-making during critical moments.  Vision: Annual vision screenings are recommended for early detection of glaucoma, cataracts, and diabetic retinopathy. These exams can also reveal signs of chronic conditions such as diabetes and high blood pressure.  Dental: Annual dental screenings help detect early signs of oral cancer, gum disease, and other conditions linked to overall health, including heart disease and diabetes.  Please see the attached documents for additional preventive care recommendations.

## 2024-04-26 NOTE — Telephone Encounter (Signed)
 Copied from CRM #[id number]. Topic: General - Other >> Apr 26, 2024 11:58 AM Mesmerise C wrote: Reason for CRM: Patient is request a handicap sticker for a show due to issue with her ankle

## 2024-04-26 NOTE — Progress Notes (Signed)
 Because this visit was a virtual/telehealth visit,  certain criteria was not obtained, such a blood pressure, CBG if applicable, and timed get up and go. Any medications not marked as taking were not mentioned during the medication reconciliation part of the visit. Any vitals not documented were not able to be obtained due to this being a telehealth visit or patient was unable to self-report a recent blood pressure reading due to a lack of equipment at home via telehealth. Vitals that have been documented are verbally provided by the patient.  This visit was performed by a medical professional under my direct supervision. I was immediately available for consultation/collaboration. I have reviewed and agree with the Annual Wellness Visit documentation.  Subjective:   Yvette [name] is a 73 y.o. who presents for a Medicare Wellness preventive visit.  As a reminder, Annual Wellness Visits don't include a physical exam, and some assessments may be limited, especially if this visit is performed virtually. We may recommend an in-person follow-up visit with your provider if needed.  Visit Complete: Virtual I connected with  Yvette [name] on 04/26/24 by a audio enabled telemedicine application and verified that I am speaking with the correct person using two identifiers.  Patient Location: Home  Provider Location: Home Office  I discussed the limitations of evaluation and management by telemedicine. The patient expressed understanding and agreed to proceed.  Vital Signs: Because this visit was a virtual/telehealth visit, some criteria may be missing or patient reported. Any vitals not documented were not able to be obtained and vitals that have been documented are patient reported.  VideoDeclined- This patient declined Librarian, academic. Therefore the visit was completed with audio only.  Persons Participating in Visit: Patient.  AWV Questionnaire: No: Patient Medicare  AWV questionnaire was not completed prior to this visit.  Cardiac Risk Factors include: advanced age (>31men, >53 women);hypertension;obesity (BMI >30kg/m2)     Objective:    Today's Vitals   04/26/24 1140  BP: 132/72  Weight: 222 lb (100.7 kg)  Height: 5' 3 (1.6 m)   Body mass index is 39.33 kg/m.     04/26/2024   11:39 AM 04/24/2023    9:47 AM 04/16/2022    1:23 PM 04/10/2021    1:05 PM 09/26/2014    4:54 PM  Advanced Directives  Does Patient Have a Medical Advance Directive? No No No No No   Does patient want to make changes to medical advance directive?     Yes - information given   Would patient like information on creating a medical advance directive? No - Patient declined No - Patient declined No - Patient declined Yes (MAU/Ambulatory/Procedural Areas - Information given)      Data saved with a previous flowsheet row definition    Current Medications (verified) Outpatient Encounter Medications as of 04/26/2024  Medication Sig   acetic acid -hydrocortisone  (VOSOL -HC) OTIC solution Place 3 drops into the right ear 3 (three) times daily. Use as needed for pain or itching   alendronate  (FOSAMAX ) 70 MG tablet TAKE 1 TABLET(70 MG) BY MOUTH EVERY 7 DAYS WITH A FULL GLASS OF WATER AND ON AN EMPTY STOMACH   amLODipine  (NORVASC ) 10 MG tablet Take 1 tablet (10 mg total) by mouth daily.   benazepril  (LOTENSIN ) 20 MG tablet Take 1 tablet (20 mg total) by mouth 2 (two) times daily.   diclofenac  sodium (VOLTAREN ) 1 % GEL Apply 4 g topically 4 (four) times daily.   ezetimibe  (ZETIA ) 10 MG tablet TAKE  1 TABLET(10 MG) BY MOUTH DAILY   fluticasone  (FLONASE ) 50 MCG/ACT nasal spray Place 1 spray into both nostrils daily.   metoprolol  succinate (TOPROL -XL) 25 MG 24 hr tablet Take 1 tablet (25 mg total) by mouth daily.   omeprazole  (PRILOSEC) 20 MG capsule Take 1 capsule (20 mg total) by mouth daily.   No facility-administered encounter medications on file as of 04/26/2024.    Allergies  (verified) Patient has no known allergies.   History: Past Medical History:  Diagnosis Date   GERD (gastroesophageal reflux disease)    Hypertension    Past Surgical History:  Procedure Laterality Date   ABDOMINAL HYSTERECTOMY     total   BREAST BIOPSY Right    knee surgery Bilateral    TONSILLECTOMY     Family History  Problem Relation Age of Onset   Alcohol abuse Mother    Cirrhosis Mother    Heart disease Father    Alcohol abuse Brother    Parkinson's disease Brother    Social History   Socioeconomic History   Marital status: Divorced    Spouse name: Not on file   Number of children: Not on file   Years of education: Not on file   Highest education level: Bachelor's degree (e.g., BA, AB, BS)  Occupational History   Not on file  Tobacco Use   Smoking status: Never   Smokeless tobacco: Never  Substance and Sexual Activity   Alcohol use: Yes    Comment: once every 2 months-cocktail at night or wine   Drug use: No   Sexual activity: Not on file  Other Topics Concern   Not on file  Social History Narrative   Not on file   Social Drivers of Health   Financial Resource Strain: Low Risk  (04/26/2024)   Overall Financial Resource Strain (CARDIA)    Difficulty of Paying Living Expenses: Not hard at all  Food Insecurity: No Food Insecurity (04/26/2024)   Hunger Vital Sign    Worried About Running Out of Food in the Last Year: Never true    Ran Out of Food in the Last Year: Never true  Transportation Needs: No Transportation Needs (04/26/2024)   PRAPARE - Administrator, Civil Service (Medical): No    Lack of Transportation (Non-Medical): No  Physical Activity: Insufficiently Active (04/26/2024)   Exercise Vital Sign    Days of Exercise per Week: 1 day    Minutes of Exercise per Session: 20 min  Stress: No Stress Concern Present (04/26/2024)   Yvette [name] of Occupational Health - Occupational Stress Questionnaire    Feeling of Stress: Only a little   Social Connections: Moderately Isolated (04/26/2024)   Social Connection and Isolation Panel    Frequency of Communication with Friends and Family: More than three times a week    Frequency of Social Gatherings with Friends and Family: Once a week    Attends Religious Services: Never    Database administrator or Organizations: Yes    Attends Engineer, structural: More than 4 times per year    Marital Status: Divorced    Tobacco Counseling Counseling given: Not Answered    Clinical Intake:  Pre-visit preparation completed: Yes  Pain : No/denies pain     BMI - recorded: 39.33 Nutritional Status: BMI > 30  Obese Nutritional Risks: None Diabetes: No  Lab Results  Component Value Date   HGBA1C 5.7 11/24/2023   HGBA1C 5.6 11/25/2022   HGBA1C 5.7 11/19/2021  How often do you need to have someone help you when you read instructions, pamphlets, or other written materials from your doctor or pharmacy?: 1 - Never  Interpreter Needed?: No  Information entered by :: brookw [email or web address]   Activities of Daily Living     04/26/2024    7:00 AM  In your present state of health, do you have any difficulty performing the following activities:  Hearing? 0  Vision? 0  Difficulty concentrating or making decisions? 0  Walking or climbing stairs? 1  Dressing or bathing? 0  Doing errands, shopping? 0  Preparing Food and eating ? N  Using the Toilet? N  In the past six months, have you accidently leaked urine? Y  Do you have problems with loss of bowel control? N  Managing your Medications? N  Managing your Finances? N  Housekeeping or managing your Housekeeping? N    Patient Care Team: Copland, Harlene BROCKS, MD as PCP - General (Family Medicine) Livingston Rigg, MD as Consulting Physician (Dermatology)  I have updated your Care Teams any recent Medical Services you may have received from other providers in the past year.     Assessment:   This is a routine wellness  examination for Yvette [name].  Hearing/Vision screen Hearing Screening - Comments:: Some difficulties hearing  Vision Screening - Comments:: Patient wears glasses    Goals Addressed               This Visit's Progress     Patient Stated (pt-stated)        To retire from [employer]       Depression Screen     04/26/2024   11:46 AM 05/26/2023   10:28 AM 04/24/2023    9:51 AM 11/25/2022   10:15 AM 04/16/2022    1:17 PM 11/19/2021    9:02 AM 04/10/2021    1:11 PM  PHQ 2/9 Scores  PHQ - 2 Score 0 0 0 0 0 0 0  PHQ- 9 Score 0          Fall Risk     04/26/2024    7:00 AM 05/26/2023   10:28 AM 04/24/2023    9:46 AM 11/25/2022   10:15 AM 04/16/2022    1:16 PM  Fall Risk   Falls in the past year? 0 0 1 0 0  Comment   tripped over something in the yard    Number falls in past yr: 0 0 0 0 0  Injury with Fall? 0 0 0 0 0  Risk for fall due to : History of fall(s) No Fall Risks No Fall Risks No Fall Risks   Follow up Falls evaluation completed Falls evaluation completed Falls evaluation completed Falls evaluation completed Falls evaluation completed;Falls prevention discussed      Data saved with a previous flowsheet row definition    MEDICARE RISK AT HOME:  Medicare Risk at Home Any stairs in or around the home?: (Patient-Rptd) Yes If so, are there any without handrails?: (Patient-Rptd) Yes Home free of loose throw rugs in walkways, pet beds, electrical cords, etc?: (Patient-Rptd) Yes Adequate lighting in your home to reduce risk of falls?: (Patient-Rptd) Yes Life alert?: (Patient-Rptd) No Use of a cane, walker or w/c?: (Patient-Rptd) No Grab bars in the bathroom?: (Patient-Rptd) No Shower chair or bench in shower?: (Patient-Rptd) No Elevated toilet seat or a handicapped toilet?: (Patient-Rptd) No  TIMED UP AND GO:  Was the test performed?  No  Cognitive Function: 6CIT completed  04/26/2024   11:48 AM 04/24/2023    9:52 AM 04/16/2022    1:34 PM  6CIT Screen  What Year? 0 points  0 points 0 points  What month? 0 points 0 points 0 points  What time? 0 points 0 points 0 points  Count back from 20 0 points 0 points 0 points  Months in reverse 0 points 2 points 0 points  Repeat phrase 0 points 0 points 0 points  Total Score 0 points 2 points 0 points    Immunizations Immunization History  Administered Date(s) Administered   Fluad Quad(high Dose 65+) 05/21/2021   Fluad Trivalent(High Dose 65+) 05/26/2023   INFLUENZA, HIGH DOSE SEASONAL PF 05/31/2016   Influenza Split 05/19/2022   Influenza,inj,Quad PF,6+ Mos 05/26/2015   Influenza-Unspecified 05/31/2020   Moderna Covid-19 Fall Seasonal Vaccine 37yrs & older 06/06/2023   PFIZER(Purple Top)SARS-COV-2 Vaccination 10/15/2019, 11/16/2019, 05/31/2020, 01/04/2021   Pneumococcal Conjugate-13 11/24/2015   Pneumococcal Polysaccharide-23 11/08/2020   Tdap 02/17/2013, 06/06/2023   Unspecified SARS-COV-2 Vaccination 05/19/2022   Zoster Recombinant(Shingrix) 05/19/2022, 06/06/2023   Zoster, Live 03/03/2015    Screening Tests Health Maintenance  Topic Date Due   Influenza Vaccine  03/19/2024   COVID-19 Vaccine (7 - 2024-25 season) 04/19/2024   Medicare Annual Wellness (AWV)  04/26/2025   MAMMOGRAM  12/02/2025   Colonoscopy  04/15/2027   DTaP/Tdap/Td (3 - Td or Tdap) 06/05/2033   Pneumococcal Vaccine: 50+ Years  Completed   DEXA SCAN  Completed   Hepatitis C Screening  Completed   Zoster Vaccines- Shingrix  Completed   HPV VACCINES  Aged Out   Meningococcal B Vaccine  Aged Out    Health Maintenance Items Addressed:   Additional Screening:  Vision Screening: Recommended annual ophthalmology exams for early detection of glaucoma and other disorders of the eye. Is the patient up to date with their annual eye exam?  No  Who is the provider or what is the name of the office in which the patient attends annual eye exams?   Dental Screening: Recommended annual dental exams for proper oral hygiene  Community  Resource Referral / Chronic Care Management: CRR required this visit?  No   CCM required this visit?  No   Plan:    I have personally reviewed and noted the following in the patient's chart:   Medical and social history Use of alcohol, tobacco or illicit drugs  Current medications and supplements including opioid prescriptions. Patient is not currently taking opioid prescriptions. Functional ability and status Nutritional status Physical activity Advanced directives List of other physicians Hospitalizations, surgeries, and ER visits in previous 12 months Vitals Screenings to include cognitive, depression, and falls Referrals and appointments  In addition, I have reviewed and discussed with patient certain preventive protocols, quality metrics, and best practice recommendations. A written personalized care plan for preventive services as well as general preventive health recommendations were provided to patient.   Lyle MARLA Right, [place]   04/26/2024   After Visit Summary: (MyChart) Due to this being a telephonic visit, the after visit summary with patients personalized plan was offered to patient via MyChart   Notes: Nothing significant to report at this time.

## 2024-04-26 NOTE — Telephone Encounter (Signed)
**Note De-identified  [place] Obfuscation** Please advise 

## 2024-05-04 ENCOUNTER — Other Ambulatory Visit: Payer: Self-pay | Admitting: Family Medicine

## 2024-05-04 ENCOUNTER — Telehealth: Payer: Self-pay | Admitting: Cardiology

## 2024-05-04 DIAGNOSIS — K219 Gastro-esophageal reflux disease without esophagitis: Secondary | ICD-10-CM

## 2024-05-04 DIAGNOSIS — I10 Essential (primary) hypertension: Secondary | ICD-10-CM

## 2024-05-04 MED ORDER — EZETIMIBE 10 MG PO TABS: 10.0000 mg | ORAL_TABLET | Freq: Every day | ORAL | 0 refills | Status: DC

## 2024-05-04 NOTE — Telephone Encounter (Signed)
*  STAT* If patient is at the pharmacy, call can be transferred to refill team.   1. Which medications need to be refilled? (please list name of each medication and dose if known) ezetimibe  (ZETIA ) 10 MG tablet    2. Would you like to learn more about the convenience, safety, & potential cost savings by using the [facility]?   3. Are you open to using the [facility] (Type [facility]. ).   4. Which pharmacy/location (including street and city if local pharmacy) is medication to be sent to? [id number] - [place], [place] - [address]    5. Do they need a 30 day or 90 day supply? 90 day

## 2024-05-04 NOTE — Telephone Encounter (Signed)
 Pt's medication was sent to pt's pharmacy as requested. Confirmation received.

## 2024-05-24 NOTE — Progress Notes (Signed)
 [facility], [facility] [employer] [address] [place], [place] [address] [phone]  Date:  05/26/2024   Name:  Yvette [name]   DOB:  19-Dec-1950   MRN:  [id number]  PCP:  Watt Harlene BROCKS, MD    Chief Complaint: Follow-up (Sinuses on my R side has been bothering me /I do take allegra twice a day)   History of Present Illness:  Yvette [name] is a 73 y.o. very pleasant female patient who presents with the following:  Seen today for periodic recheck Last seen by myself in April  History of hypertension, hyperlipidemia, GERD, obesity, osteoporosis  Yvette [name] a few years ago, she now lives with her daughter [name].  She has 2 children and her grands are 1 and 54 yo this year  Labs done 6 months ago - will update at next visit  Flu shot - give today  Mammogram UTD Colon UTD   Fosamax  Amlodipine  Benazepril  Zetia   Toprol  xl Prilosec   Discussed the use of AI scribe software for clinical note transcription with the patient, who gave verbal consent to proceed.  History of Present Illness Yvette [name] is a 73 year old female who presents for medication refills and a flu shot.  She is currently taking amlodipine  and benazepril  for hypertension, with benazepril  taken twice daily. She also takes metoprolol  and Fosamax . She is frustrated with the pharmacy's refill schedule, which often results in multiple trips.  She has a history of chronic ear infections, which have led her to stop swimming. She describes a sensation of her ear being 'clogged up' and recalls a past experience at the beach where she felt a 'zing' down her ear. She has previously used ear drops made of half rubbing alcohol and half white vinegar.  She experiences sinus discomfort with associated eye watering and nasal drainage, which has been ongoing for a couple of days- mostly just on the right side. She applies pressure to her sinuses to facilitate  drainage.  She is experiencing significant stress due to family issues, including caring for two sisters with Alzheimer's disease, one of whom is in hospice. She has difficulty staying asleep but declines medication for sleep, citing adverse effects from previous experiences with sleep aids. She feels like she is doing ok from a mood standpoint and does not wan to use any medication for this right now. She does have supportive friends and family       Patient Active Problem List   Diagnosis Date Noted   Coronary artery calcification calcium  score 489 which is 90% percentile 02/17/2023   Dyspnea on exertion 02/17/2023   Screen for colon cancer 02/12/2023   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/2023   Primary osteoarthritis of left hip 01/21/2023   Lumbar spondylosis 01/21/2023   Lumbar degenerative disc disease 01/21/2023   Posterior tibial tendonitis, right 06/05/2022   Pes planus of right foot 06/05/2022   Accessory navicular bone of right foot 06/05/2022   ETD (Eustachian tube dysfunction), right 03/13/2022   Osteoporosis 10/27/2020   Hyperlipidemia with target LDL less than 100 05/26/2015   Morbid obesity (HCC) 03/16/2015   Hypertension 02/17/2013   GERD (gastroesophageal reflux disease) 02/17/2013    Past Medical History:  Diagnosis Date   GERD (gastroesophageal reflux disease)    Hypertension     Past Surgical History:  Procedure Laterality Date   ABDOMINAL HYSTERECTOMY     total  BREAST BIOPSY Right    knee surgery Bilateral    TONSILLECTOMY      Social History   Tobacco Use   Smoking status: Never   Smokeless tobacco: Never  Substance Use Topics   Alcohol use: Yes    Comment: once every 2 months-cocktail at night or wine   Drug use: No    Family History  Problem Relation Age of Onset   Alcohol abuse Mother    Cirrhosis Mother    Heart disease Father    Alcohol abuse Brother    Parkinson's disease Brother     No Known  Allergies  Medication list has been reviewed and updated.  Current Outpatient Medications on File Prior to Visit  Medication Sig Dispense Refill   acetic acid -hydrocortisone  (VOSOL -HC) OTIC solution Place 3 drops into the right ear 3 (three) times daily. Use as needed for pain or itching 10 mL 0   alendronate  (FOSAMAX ) 70 MG tablet TAKE 1 TABLET(70 MG) BY MOUTH EVERY 7 DAYS WITH A FULL GLASS OF WATER AND ON AN EMPTY STOMACH 12 tablet 3   diclofenac  sodium (VOLTAREN ) 1 % GEL Apply 4 g topically 4 (four) times daily. 500 g 1   ezetimibe  (ZETIA ) 10 MG tablet Take 1 tablet (10 mg total) by mouth daily. 90 tablet 0   fluticasone  (FLONASE ) 50 MCG/ACT nasal spray Place 1 spray into both nostrils daily. 16 g 5   omeprazole  (PRILOSEC) 20 MG capsule Take 1 capsule (20 mg total) by mouth daily. 90 capsule 0   No current facility-administered medications on file prior to visit.    Review of Systems:  As per HPI- otherwise negative.   Physical Examination: Vitals:   05/26/24 1052  BP: 132/76  Pulse: 67  SpO2: 98%   Vitals:   05/26/24 1052  Weight: 223 lb 9.6 oz (101.4 kg)  Height: 5' 3 (1.6 m)   Body mass index is 39.61 kg/m. Ideal Body Weight: Weight in (lb) to have BMI = 25: 140.8  GEN: no acute distress.  Obese, looks well  HEENT: Atraumatic, Normocephalic.  Bilateral TM wnl, oropharynx normal.  PEERL,EOMI.   Ears and Nose: No external deformity. CV: RRR, No M/G/R. No JVD. No thrill. No extra heart sounds. PULM: CTA B, no wheezes, crackles, rhonchi. No retractions. No resp. distress. No accessory muscle use. ABD: S, NT, ND, +BS. No rebound. No HSM. EXTR: No c/c/e PSYCH: Normally interactive. Conversant.    Assessment and Plan: Primary hypertension  Degeneration of intervertebral disc of lumbar region with discogenic back pain  Hyperlipidemia with target LDL less than 100  Essential hypertension - Plan: amLODipine  (NORVASC ) 10 MG tablet, benazepril  (LOTENSIN ) 20 MG tablet,  metoprolol  succinate (TOPROL -XL) 25 MG 24 hr tablet  Sinus pressure - Plan: amoxicillin  (AMOXIL ) 875 MG tablet  Immunization due - Plan: Flu vaccine HIGH DOSE PF(Fluzone Trivalent)  Assessment & Plan Acute sinus congestion Acute sinus congestion with eye watering and nasal drainage. Possible sinus infection considered.  I sent in an rx for amox that she can fill if not feeling better in the next few days  Chronic ear congestion with history of recurrent ear infections Chronic ear congestion with sensation of clogging, exacerbated by swimming. No current ear infection. - Recommend moldable wax earplugs for swimming.  Hypertension Hypertension well-controlled on current medication. - Refill amlodipine  and benazepril  prescriptions.  Hyperlipidemia Hyperlipidemia managed with current medication.  General Health Maintenance General health maintenance up to date. Mammogram and colonoscopy current. Bone density scan performed  a year ago, repeat next year. Blood work in April showed no major concerns. - Administer flu shot. - Plan blood work at next visit in six months. Recommend covid booster at pharmacy   Signed Harlene Schroeder, MD "

## 2024-05-26 ENCOUNTER — Ambulatory Visit (INDEPENDENT_AMBULATORY_CARE_PROVIDER_SITE_OTHER): Admitting: Family Medicine

## 2024-05-26 ENCOUNTER — Encounter: Payer: Self-pay | Admitting: Family Medicine

## 2024-05-26 VITALS — BP 132/76 | HR 67 | Ht 63.0 in | Wt 223.6 lb

## 2024-05-26 DIAGNOSIS — Z23 Encounter for immunization: Secondary | ICD-10-CM | POA: Diagnosis not present

## 2024-05-26 DIAGNOSIS — E785 Hyperlipidemia, unspecified: Secondary | ICD-10-CM | POA: Diagnosis not present

## 2024-05-26 DIAGNOSIS — I10 Essential (primary) hypertension: Secondary | ICD-10-CM

## 2024-05-26 DIAGNOSIS — M5136 Other intervertebral disc degeneration, lumbar region with discogenic back pain only: Secondary | ICD-10-CM | POA: Diagnosis not present

## 2024-05-26 DIAGNOSIS — J3489 Other specified disorders of nose and nasal sinuses: Secondary | ICD-10-CM

## 2024-05-26 MED ORDER — AMOXICILLIN 875 MG PO TABS: 875.0000 mg | ORAL_TABLET | Freq: Two times a day (BID) | ORAL | 0 refills | Status: DC

## 2024-05-26 MED ORDER — BENAZEPRIL HCL 20 MG PO TABS
20.0000 mg | ORAL_TABLET | Freq: Two times a day (BID) | ORAL | 3 refills | Status: AC
Start: 2024-05-26 — End: ?

## 2024-05-26 MED ORDER — METOPROLOL SUCCINATE ER 25 MG PO TB24
25.0000 mg | ORAL_TABLET | Freq: Every day | ORAL | 3 refills | Status: AC
Start: 2024-05-26 — End: ?

## 2024-05-26 MED ORDER — AMLODIPINE BESYLATE 10 MG PO TABS
10.0000 mg | ORAL_TABLET | Freq: Every day | ORAL | 3 refills | Status: AC
Start: 2024-05-26 — End: ?

## 2024-05-26 NOTE — Patient Instructions (Addendum)
 It was good to see you today- please see me in about 6 months Flu shot given today Recommend a covid booster this fall at your convenience   [facility] [address] [place], [place] [address], [place]  Phone: [phone]

## 2024-06-21 ENCOUNTER — Telehealth (HOSPITAL_BASED_OUTPATIENT_CLINIC_OR_DEPARTMENT_OTHER): Payer: Self-pay

## 2024-06-25 ENCOUNTER — Ambulatory Visit (HOSPITAL_BASED_OUTPATIENT_CLINIC_OR_DEPARTMENT_OTHER)
Admission: RE | Admit: 2024-06-25 | Discharge: 2024-06-25 | Disposition: A | Discharge status: Home / Self Care | Source: Ambulatory Visit | Attending: Family Medicine | Admitting: Family Medicine

## 2024-06-25 ENCOUNTER — Encounter (HOSPITAL_BASED_OUTPATIENT_CLINIC_OR_DEPARTMENT_OTHER): Payer: Self-pay

## 2024-06-25 DIAGNOSIS — R911 Solitary pulmonary nodule: Secondary | ICD-10-CM

## 2024-07-30 ENCOUNTER — Other Ambulatory Visit: Payer: Self-pay | Admitting: Family Medicine

## 2024-07-30 DIAGNOSIS — K219 Gastro-esophageal reflux disease without esophagitis: Secondary | ICD-10-CM

## 2024-08-01 ENCOUNTER — Other Ambulatory Visit: Payer: Self-pay | Admitting: Cardiology

## 2024-08-02 ENCOUNTER — Encounter: Payer: Self-pay | Admitting: Cardiology

## 2024-08-02 ENCOUNTER — Other Ambulatory Visit (HOSPITAL_COMMUNITY): Payer: Self-pay

## 2024-08-02 ENCOUNTER — Telehealth: Payer: Self-pay

## 2024-08-02 ENCOUNTER — Encounter: Payer: Self-pay | Admitting: *Deleted

## 2024-08-02 ENCOUNTER — Ambulatory Visit: Attending: Cardiology | Admitting: Cardiology

## 2024-08-02 VITALS — BP 150/84 | HR 63 | Ht 63.0 in | Wt 222.0 lb

## 2024-08-02 DIAGNOSIS — K219 Gastro-esophageal reflux disease without esophagitis: Secondary | ICD-10-CM

## 2024-08-02 DIAGNOSIS — E785 Hyperlipidemia, unspecified: Secondary | ICD-10-CM

## 2024-08-02 DIAGNOSIS — I10 Essential (primary) hypertension: Secondary | ICD-10-CM

## 2024-08-02 DIAGNOSIS — I2510 Atherosclerotic heart disease of native coronary artery without angina pectoris: Secondary | ICD-10-CM

## 2024-08-02 MED ORDER — NEXLIZET 180-10 MG PO TABS: 1.0000 | ORAL_TABLET | Freq: Every day | ORAL | 3 refills | Status: AC

## 2024-08-02 NOTE — Progress Notes (Unsigned)
 Cardiology Office Note:    Date:  08/02/2024   ID:  Mackenzee Becvar, DOB 06/07/1951, MRN [id number]  PCP:  Watt Harlene BROCKS, MD  Cardiologist:  Lamar Fitch, MD    Referring MD: Watt Harlene BROCKS, MD   Chief Complaint  Patient presents with   Annual Exam    History of Present Illness:    Yvette [name] is a 73 y.o. female past medical history significant for elevated calcium  score 90th percentile, essential hypertension, dyslipidemia, intolerance to statin, secondhand smoking, family history of coronary disease but not premature.  Comes today to months for follow-up overall cardiac wise doing well denies of any chest pain tightness squeezing pressure burning chest, she does have difficulty walking because of chronic feet problem.  She is taking Zetia  right now.  Past Medical History:  Diagnosis Date   GERD (gastroesophageal reflux disease)    Hypertension     Past Surgical History:  Procedure Laterality Date   ABDOMINAL HYSTERECTOMY     total   BREAST BIOPSY Right    knee surgery Bilateral    TONSILLECTOMY      Current Medications: Active Medications[1]   Allergies:   Patient has no known allergies.   Social History   Socioeconomic History   Marital status: Divorced    Spouse name: Not on file   Number of children: Not on file   Years of education: Not on file   Highest education level: Bachelor's degree (e.g., BA, AB, BS)  Occupational History   Not on file  Tobacco Use   Smoking status: Never   Smokeless tobacco: Never  Substance and Sexual Activity   Alcohol use: Yes    Comment: once every 2 months-cocktail at night or wine   Drug use: No   Sexual activity: Not on file  Other Topics Concern   Not on file  Social History Narrative   Not on file   Social Drivers of Health   Tobacco Use: Low Risk (08/02/2024)   Patient History    Smoking Tobacco Use: Never    Smokeless Tobacco Use: Never    Passive Exposure: Not on file  Financial Resource  Strain: Low Risk (04/26/2024)   Overall Financial Resource Strain (CARDIA)    Difficulty of Paying Living Expenses: Not hard at all  Food Insecurity: No Food Insecurity (04/26/2024)   Epic    Worried About Radiation Protection Practitioner of Food in the Last Year: Never true    Ran Out of Food in the Last Year: Never true  Transportation Needs: No Transportation Needs (04/26/2024)   Epic    Lack of Transportation (Medical): No    Lack of Transportation (Non-Medical): No  Physical Activity: Insufficiently Active (04/26/2024)   Exercise Vital Sign    Days of Exercise per Week: 1 day    Minutes of Exercise per Session: 20 min  Stress: No Stress Concern Present (04/26/2024)   [employer] of Occupational Health - Occupational Stress Questionnaire    Feeling of Stress: Only a little  Social Connections: Moderately Isolated (04/26/2024)   Social Connection and Isolation Panel    Frequency of Communication with Friends and Family: More than three times a week    Frequency of Social Gatherings with Friends and Family: Once a week    Attends Religious Services: Never    Database Administrator or Organizations: Yes    Attends Engineer, Structural: More than 4 times per year    Marital Status: Divorced  Depression (PHQ2-9): Low Risk (  05/26/2024)   Depression (PHQ2-9)    PHQ-2 Score: 4  Alcohol Screen: Low Risk (04/26/2024)   Alcohol Screen    Last Alcohol Screening Score (AUDIT): 1  Housing: Low Risk (04/26/2024)   Epic    Unable to Pay for Housing in the Last Year: No    Number of Times Moved in the Last Year: 0    Homeless in the Last Year: No  Utilities: Not At Risk (04/26/2024)   Epic    Threatened with loss of utilities: No  Health Literacy: Adequate Health Literacy (04/26/2024)   B1300 Health Literacy    Frequency of need for help with medical instructions: Never     Family History: The patient's family history includes Alcohol abuse in her brother and mother; Cirrhosis in her mother; Heart disease in  her father; Parkinson's disease in her brother. ROS:   Please see the history of present illness.    All 14 point review of systems negative except as described per history of present illness  EKGs/Labs/Other Studies Reviewed:         Recent Labs: 11/24/2023: ALT 14; BUN 14; Creatinine, Ser 0.72; Hemoglobin 12.5; Platelets 319.0; Potassium 4.4; Sodium 140; TSH 2.85  Recent Lipid Panel    Component Value Date/Time   CHOL 200 11/24/2023 1104   CHOL 213 (H) 11/24/2015 1110   CHOL 211 (H) 02/17/2013 1645   TRIG 194.0 (H) 11/24/2023 1104   TRIG 151 (H) 09/26/2014 1703   TRIG 112 02/17/2013 1645   HDL 46.40 11/24/2023 1104   HDL 41 11/24/2015 1110   HDL 51 09/26/2014 1703   HDL 50 02/17/2013 1645   CHOLHDL 4 11/24/2023 1104   VLDL 38.8 11/24/2023 1104   LDLCALC 115 (H) 11/24/2023 1104   LDLCALC 132 (H) 11/24/2015 1110   LDLCALC 124 (H) 10/26/2013 1640   LDLCALC 139 (H) 02/17/2013 1645   LDLDIRECT 140.0 11/25/2022 1040    Physical Exam:    VS:  BP (!) 150/84   Pulse 63   Ht 5' 3 (1.6 m)   Wt 222 lb (100.7 kg)   SpO2 97%   BMI 39.33 kg/m     Wt Readings from Last 3 Encounters:  08/02/24 222 lb (100.7 kg)  05/26/24 223 lb 9.6 oz (101.4 kg)  04/26/24 222 lb (100.7 kg)     GEN:  Well nourished, well developed in no acute distress HEENT: Normal NECK: No JVD; No carotid bruits LYMPHATICS: No lymphadenopathy CARDIAC: RRR, no murmurs, no rubs, no gallops RESPIRATORY:  Clear to auscultation without rales, wheezing or rhonchi  ABDOMEN: Soft, non-tender, non-distended MUSCULOSKELETAL:  No edema; No deformity  SKIN: Warm and dry LOWER EXTREMITIES: no swelling NEUROLOGIC:  Alert and oriented x 3 PSYCHIATRIC:  Normal affect   ASSESSMENT:    1. Coronary artery disease without angina pectoris, unspecified vessel or lesion type, unspecified whether native or transplanted heart   2. Primary hypertension   3. Coronary artery calcification calcium  score 489 which is 90%  percentile   4. Gastroesophageal reflux disease without esophagitis   5. Hyperlipidemia with target LDL less than 100    PLAN:    In order of problems listed above:  Coronary disease stable no chest pain tightness squeezing pressure mid chest continue present management. Dyslipidemia did review KPN LDL 115 HDL 46.  That is on Zetia  10 mg daily.  I suggested starting injectable medication however she is scared of dementia we had a long discussion about her history of multiple family members  with dementia and she is wondering when we reduce cholesterol make her being demented.  I spent great of time explained to her that this is not the case in spite of that she is very reluctant to take any extra medication, however, finally she agreed to try Nexlizet  at, will try and see how she responds to this therapy. Gastroesophageal reflux disease stable. Essential hypertension blood pressure will be elevated today but she is check it at home it is always good we will continue monitoring   Medication Adjustments/Labs and Tests Ordered: Current medicines are reviewed at length with the patient today.  Concerns regarding medicines are outlined above.  Orders Placed This Encounter  Procedures   EKG 12-Lead   Medication changes: No orders of the defined types were placed in this encounter.   Signed, Lamar DOROTHA Fitch, MD, [facility] 08/02/2024 10:27 AM     [facility]    [1]  Current Meds  Medication Sig   alendronate  (FOSAMAX ) 70 MG tablet TAKE 1 TABLET(70 MG) BY MOUTH EVERY 7 DAYS WITH A FULL GLASS OF WATER AND ON AN EMPTY STOMACH   amLODipine  (NORVASC ) 10 MG tablet Take 1 tablet (10 mg total) by mouth daily.   benazepril  (LOTENSIN ) 20 MG tablet Take 1 tablet (20 mg total) by mouth 2 (two) times daily.   diclofenac  sodium (VOLTAREN ) 1 % GEL Apply 4 g topically 4 (four) times daily.   ezetimibe  (ZETIA ) 10 MG tablet Take 1 tablet (10 mg total) by mouth daily.   fluticasone   (FLONASE ) 50 MCG/ACT nasal spray Place 1 spray into both nostrils daily.   metoprolol  succinate (TOPROL -XL) 25 MG 24 hr tablet Take 1 tablet (25 mg total) by mouth daily.   omeprazole  (PRILOSEC) 20 MG capsule Take 1 capsule (20 mg total) by mouth daily.

## 2024-08-02 NOTE — Telephone Encounter (Signed)
 Pharmacy Patient Advocate Encounter   Received notification from Physician's Office that prior authorization for NEXLIZET  is required/requested.   Insurance verification completed.   The patient is insured through [employer].   Per test claim: PA required; PA submitted to above mentioned insurance via Latent Key/confirmation #/EOC [id number] Status is pending

## 2024-08-02 NOTE — Telephone Encounter (Signed)
-----   Message from Nurse Olam DEL, RN sent at 08/02/2024 10:30 AM EST ----- Pt will be starting Nexlizet - TY Dede

## 2024-08-02 NOTE — Patient Instructions (Addendum)
 Medication Instructions:   Nexlizet  1 tablet daily   Lab Work: 3rd Floor   [address]  Your physician recommends that you return for lab work in:   6 weeks You need to have labs done when you are fasting.  You can come Monday through Friday 8:00 am to 11:30AM and 1:00 to 4:00. You do not need to make an appointment as the order has already been placed.    Testing/Procedures: None Ordered   Follow-Up: At [facility], you and your health needs are our priority.  As part of our continuing mission to provide you with exceptional heart care, we have created designated Provider Care Teams.  These Care Teams include your primary Cardiologist (physician) and Advanced Practice Providers (APPs -  Physician Assistants and Nurse Practitioners) who all work together to provide you with the care you need, when you need it.  We recommend signing up for the patient portal called MyChart.  Sign up information is provided on this After Visit Summary.  MyChart is used to connect with patients for Virtual Visits (Telemedicine).  Patients are able to view lab/test results, encounter notes, upcoming appointments, etc.  Non-urgent messages can be sent to your provider as well.   To learn more about what you can do with MyChart, go to [email or web address].    Your next appointment:   12 month(s)  The format for your next appointment:   In Person  Provider:   Lamar Fitch, MD    Other Instructions NA

## 2024-08-05 ENCOUNTER — Other Ambulatory Visit (HOSPITAL_COMMUNITY): Payer: Self-pay

## 2024-08-05 NOTE — Telephone Encounter (Signed)
 Pharmacy Patient Advocate Encounter  Received notification from [employer] that Prior Authorization for  Nexlizet  has been APPROVED from 08/20/23 to 08/18/25 PER TEST CLAIM, PHARMACY HAS FILLED

## 2024-08-15 ENCOUNTER — Other Ambulatory Visit: Payer: Self-pay | Admitting: Family Medicine

## 2024-08-15 DIAGNOSIS — M810 Age-related osteoporosis without current pathological fracture: Secondary | ICD-10-CM

## 2024-11-24 ENCOUNTER — Ambulatory Visit: Admitting: Family Medicine
